# Patient Record
Sex: Female | Born: 1980 | Race: White | Hispanic: Yes | Marital: Single | Smoking: Never smoker
Health system: Southern US, Community
[De-identification: ages and names within clinical notes are randomized; demographics above are authoritative.]

## PROBLEM LIST (undated history)

## (undated) DIAGNOSIS — D649 Anemia, unspecified: Secondary | ICD-10-CM

## (undated) DIAGNOSIS — E559 Vitamin D deficiency, unspecified: Secondary | ICD-10-CM

## (undated) DIAGNOSIS — K625 Hemorrhage of anus and rectum: Secondary | ICD-10-CM

## (undated) HISTORY — DX: Vitamin D deficiency, unspecified: E55.9

## (undated) HISTORY — DX: Anemia, unspecified: D64.9

## (undated) HISTORY — DX: Hemorrhage of anus and rectum: K62.5

## (undated) HISTORY — PX: HERNIA REPAIR: SHX51

---

## 2004-06-07 ENCOUNTER — Other Ambulatory Visit: Admission: RE | Admit: 2004-06-07 | Discharge: 2004-06-07 | Payer: Self-pay | Admitting: Obstetrics & Gynecology

## 2005-01-06 ENCOUNTER — Inpatient Hospital Stay (HOSPITAL_COMMUNITY): Admission: RE | Admit: 2005-01-06 | Discharge: 2005-01-08 | Payer: Self-pay | Admitting: Obstetrics & Gynecology

## 2007-07-18 ENCOUNTER — Inpatient Hospital Stay (HOSPITAL_COMMUNITY): Admission: AD | Admit: 2007-07-18 | Discharge: 2007-07-19 | Payer: Self-pay | Admitting: Obstetrics and Gynecology

## 2008-01-27 ENCOUNTER — Ambulatory Visit: Payer: Self-pay | Admitting: Gynecology

## 2008-02-05 ENCOUNTER — Ambulatory Visit: Payer: Self-pay | Admitting: Gynecology

## 2008-02-19 ENCOUNTER — Ambulatory Visit: Payer: Self-pay | Admitting: Gynecology

## 2008-08-17 ENCOUNTER — Other Ambulatory Visit: Admission: RE | Admit: 2008-08-17 | Discharge: 2008-08-17 | Payer: Self-pay | Admitting: Gynecology

## 2008-08-17 ENCOUNTER — Ambulatory Visit: Payer: Self-pay | Admitting: Gynecology

## 2008-08-17 ENCOUNTER — Encounter: Payer: Self-pay | Admitting: Gynecology

## 2008-10-19 ENCOUNTER — Ambulatory Visit: Payer: Self-pay | Admitting: Gynecology

## 2010-01-18 ENCOUNTER — Other Ambulatory Visit: Admission: RE | Admit: 2010-01-18 | Discharge: 2010-01-18 | Payer: Self-pay | Admitting: Gynecology

## 2010-01-18 ENCOUNTER — Ambulatory Visit: Payer: Self-pay | Admitting: Gynecology

## 2010-01-27 ENCOUNTER — Ambulatory Visit: Payer: Self-pay | Admitting: Women's Health

## 2010-02-04 ENCOUNTER — Ambulatory Visit: Payer: Self-pay | Admitting: Gynecology

## 2010-03-26 ENCOUNTER — Emergency Department (HOSPITAL_BASED_OUTPATIENT_CLINIC_OR_DEPARTMENT_OTHER)
Admission: EM | Admit: 2010-03-26 | Discharge: 2010-03-26 | Payer: Self-pay | Source: Home / Self Care | Admitting: Emergency Medicine

## 2010-04-27 ENCOUNTER — Other Ambulatory Visit: Payer: Self-pay

## 2010-04-27 ENCOUNTER — Ambulatory Visit: Admit: 2010-04-27 | Payer: Self-pay | Admitting: Gynecology

## 2010-08-12 NOTE — Op Note (Signed)
NAME:  Jill Thompson, Jill Thompson NO.:  [id number]   MEDICAL RECORD NO.:  [id number]          PATIENT TYPE:  INP   LOCATION:  9129                          FACILITY:  [facility]   PHYSICIAN:  Ilda Mori, M.D.   DATE OF BIRTH:  [birth year]   DATE OF PROCEDURE:  01/06/2005  DATE OF DISCHARGE:                                 OPERATIVE REPORT   PREOPERATIVE DIAGNOSIS:  Previous cesarean section x2. Term pregnancy   POSTOP DIAGNOSIS:  Previous cesarean section x2.  Term pregnancy   PROCEDURE:  Repeat low transverse cesarean section.   SURGEON:  Dr. Ilda Mori.   ASSISTANT:  Dr. Miguel Aschoff.   ANESTHESIA:  Was spinal.   ESTIMATED BLOOD LOSS:  Was 600 mL.   SPECIMENS:  No specimens were sent to pathology.   FINDINGS:  Female infant weighing 6 pounds 5 ounces Apgar scores 9 and 9.  Clear amniotic fluid. Normal-appearing tubes and ovaries.   INDICATIONS:  This is a 30 year old gravida 3, para 2 with an estimated  confinement of November 26. The patient requested that the procedure be done  at 38 weeks for family reasons.   PROCEDURE:  The patient was taken to the operating room and spinal  anesthesia was placed. The abdomen was prepped, draped in sterile fashion.  The bladder was catheterized. A low transverse incision made through  previous laparotomy skin incision was carried down to the fascia was which  was extended transversely. The anterior rectus sheath was then divided from  the underlying rectus muscle. Rectus muscle was entered in the midline and  the peritoneum was entered and the rectus muscle and perineum were extended  bluntly. Lower segment was identified. An incision was made, carried down to  the amniotic sac which was then opened. The lower skin was then extended  bluntly. The infant was delivered with the aid of a Kiwi vacuum extractor.  Cord bloods were obtained and the placenta and cord blood was given to the  tech for cord blood collection. The uterus  was bluntly curettaged. The lower  skin was closed with a running interlocking 0 Vicryl suture and then second  layer of a running imbricating 0  Vicryl suture was then used creating a double layer closure of the lower  segment. The parietal peritoneum and rectus muscles then closed with running  3-0 Vicryl suture and the fascia was closed with 0 Vicryl suture. The skin  was closed with staples. The patient procedure well, left the operating room  in good condition.      Ilda Mori, M.D.  Electronically Signed     RK/MEDQ  D:  01/06/2005  T:  01/06/2005  Job:  [id number]

## 2010-08-12 NOTE — Discharge Summary (Signed)
NAME:  Jill Thompson, Jill Thompson NO.:  [id number]   MEDICAL RECORD NO.:  [id number]          PATIENT TYPE:  INP   LOCATION:  9129                          [facility]   PHYSICIAN:  Ilda Mori, M.D.   DATE OF BIRTH:  1980-11-13   DATE OF ADMISSION:  01/06/2005  DATE OF DISCHARGE:  01/08/2005                                 DISCHARGE SUMMARY   FINAL DIAGNOSES:  1.  History of previous cesarean section x2.  2.  Term pregnancy.   PROCEDURE:  Repeat low transverse cesarean section.  Surgeon:  Dr. Ilda Mori.  Assistant:  Dr. Miguel Aschoff.  Complications:  None.   This 30 year old, G3 P2, had an estimated date of confinement of February 19, 2005, and the patient requested the procedure be performed at 38 weeks  secondary to family reasons.  The patient had two previous cesarean sections  and was scheduled for a third with this pregnancy.  The patient's antepartum  course up to this point had been complicated by findings of increased Down  syndrome risk on her quad screen.  She declined amniocentesis and nothing  was noted on ultrasound.  The patient was taken to the operating room by Dr. Ilda Mori, on  January 06, 2005, where a repeat low transverse cesarean section was  performed with the delivery of a 6-pound 5-ounce female infant without  Apgar's of 9 and 9.  The delivery without complication.  The patient's postoperative course was benign without any significant  fevers.  She was felt ready for discharge on postoperative day #2.   She was sent home on a regular diet.   Told to decrease at her activities.   1.  Told to continue her prenatal vitamins.  2.  Was given Tylox, #30, one to two every four hours as needed for pain.  3.  Told she could use over-the-counter ibuprofen up to 600 mg every six      hours as needed for pain.   Was to follow up in the office in four weeks.   The patient, of course, is to call with increased fever, bleeding, or  pain.   DISCHARGE LABORATORY:  The patient had a hemoglobin of 11.4, white blood  cell count 10.5, and platelets of 166,000.      Leilani Able, P.A.-C.      Ilda Mori, M.D.  Electronically Signed    MB/MEDQ  D:  02/01/2005  T:  02/01/2005  Job:  [id number]

## 2010-09-15 ENCOUNTER — Emergency Department (HOSPITAL_BASED_OUTPATIENT_CLINIC_OR_DEPARTMENT_OTHER)
Admission: EM | Admit: 2010-09-15 | Discharge: 2010-09-15 | Disposition: A | Discharge status: Home / Self Care | Payer: Self-pay | Attending: Emergency Medicine | Admitting: Emergency Medicine

## 2010-09-15 DIAGNOSIS — Y9229 Other specified public building as the place of occurrence of the external cause: Secondary | ICD-10-CM | POA: Insufficient documentation

## 2010-09-15 DIAGNOSIS — S0003XA Contusion of scalp, initial encounter: Secondary | ICD-10-CM | POA: Insufficient documentation

## 2010-09-15 DIAGNOSIS — W208XXA Other cause of strike by thrown, projected or falling object, initial encounter: Secondary | ICD-10-CM | POA: Insufficient documentation

## 2010-09-19 ENCOUNTER — Emergency Department (INDEPENDENT_AMBULATORY_CARE_PROVIDER_SITE_OTHER): Payer: No Typology Code available for payment source

## 2010-09-19 ENCOUNTER — Emergency Department (HOSPITAL_BASED_OUTPATIENT_CLINIC_OR_DEPARTMENT_OTHER)
Admission: EM | Admit: 2010-09-19 | Discharge: 2010-09-19 | Disposition: A | Discharge status: Home / Self Care | Payer: No Typology Code available for payment source | Attending: Emergency Medicine | Admitting: Emergency Medicine

## 2010-09-19 DIAGNOSIS — IMO0002 Reserved for concepts with insufficient information to code with codable children: Secondary | ICD-10-CM | POA: Insufficient documentation

## 2010-09-19 DIAGNOSIS — S0003XA Contusion of scalp, initial encounter: Secondary | ICD-10-CM | POA: Insufficient documentation

## 2010-09-19 DIAGNOSIS — S1093XA Contusion of unspecified part of neck, initial encounter: Secondary | ICD-10-CM | POA: Insufficient documentation

## 2010-09-19 DIAGNOSIS — X58XXXA Exposure to other specified factors, initial encounter: Secondary | ICD-10-CM

## 2010-09-19 DIAGNOSIS — Y9301 Activity, walking, marching and hiking: Secondary | ICD-10-CM | POA: Insufficient documentation

## 2010-09-19 DIAGNOSIS — J3489 Other specified disorders of nose and nasal sinuses: Secondary | ICD-10-CM

## 2010-09-22 ENCOUNTER — Ambulatory Visit (INDEPENDENT_AMBULATORY_CARE_PROVIDER_SITE_OTHER): Payer: Managed Care, Other (non HMO) | Admitting: Gynecology

## 2010-09-22 DIAGNOSIS — N949 Unspecified condition associated with female genital organs and menstrual cycle: Secondary | ICD-10-CM

## 2010-09-22 DIAGNOSIS — B373 Candidiasis of vulva and vagina: Secondary | ICD-10-CM

## 2010-09-22 DIAGNOSIS — R823 Hemoglobinuria: Secondary | ICD-10-CM

## 2010-09-22 DIAGNOSIS — N898 Other specified noninflammatory disorders of vagina: Secondary | ICD-10-CM

## 2010-10-24 ENCOUNTER — Ambulatory Visit (INDEPENDENT_AMBULATORY_CARE_PROVIDER_SITE_OTHER): Payer: Managed Care, Other (non HMO) | Admitting: Gynecology

## 2010-10-24 VITALS — BP 112/70

## 2010-10-24 DIAGNOSIS — R198 Other specified symptoms and signs involving the digestive system and abdomen: Secondary | ICD-10-CM

## 2010-10-24 DIAGNOSIS — K649 Unspecified hemorrhoids: Secondary | ICD-10-CM

## 2010-10-24 NOTE — Progress Notes (Signed)
30 year old patient presented to the office today complaining of painful rectal area for the past 2-3 days making difficult for her to sit or even to walk. On examination it appears the patient has a significantly large external hemorrhoid very tender on contact possibly thrombosed. Although black pigmentation wasnot  noted. 2% lidocaine gel was applied to the area after inspection. She will be prescribed and an Anamantle HC 2-3 times a day. She'll do warm sitz baths. She was also given a prescription for Tylox to take one by mouth every 4-6 hours when necessary pain. A she will follow up with the general surgeon Dr. Ovidio Kin tomorrow for an appointment.

## 2010-10-25 ENCOUNTER — Encounter (INDEPENDENT_AMBULATORY_CARE_PROVIDER_SITE_OTHER): Payer: Self-pay | Admitting: Surgery

## 2010-10-25 ENCOUNTER — Ambulatory Visit (INDEPENDENT_AMBULATORY_CARE_PROVIDER_SITE_OTHER): Payer: Managed Care, Other (non HMO) | Admitting: Surgery

## 2010-10-25 VITALS — BP 104/84 | HR 64 | Temp 97.5°F | Ht 62.0 in | Wt 163.4 lb

## 2010-10-25 DIAGNOSIS — K645 Perianal venous thrombosis: Secondary | ICD-10-CM

## 2010-10-25 NOTE — Progress Notes (Signed)
Chief Complaint  Patient presents with  . Other    new pt- eval thom hems    Jill Thompson (goes by Jill Thompson) is a 30 y.o. [place] female who is a patient of Jill Thompson., MD and comes to me today for thrombosed hemorrhoids.  She has had a symptomatic hemorrhoid for 6 years, since her last daughter was born.  But this time, her hemorrhoids flared up worse starting Friday (8/27) and continued to get worse until Sunday (8/29).  She saw Dr. Livia Thompson Monday, 11/24/10, who gave her pain meds and a prescription for the hemorrhoids.  She has not filled the hemorrhoid prescription yet.  Past Medical History  Diagnosis Date  . Hemorrhoids   . Rectal bleeding   . Anemia     Past Surgical History  Procedure Date  . Cesarean section     3  . Hernia repair     Current Outpatient Prescriptions  Medication Sig Dispense Refill  . IUD's (PARAGARD INTRAUTERINE COPPER) IUD by Intrauterine route. Inserted 02/19/08       . oxyCODONE-acetaminophen (TYLOX) 5-500 MG per capsule Every 4 hours.      . Benzoyl Peroxide (ACNE MEDICATION EX) Apply topically.        . IRON PO Take by mouth.        . Oxycodone HCl 20 MG TABS Take 20 mg by mouth as needed.          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   PHYSICAL EXAM: Filed Vitals:   10/25/10 1733  BP: 104/84  Pulse: 64  Temp: 97.5 F (36.4 C)       Body mass index is 29.89 kg/(m^2).  Lungs: Clear to auscultation. Heart: Regular rate and rhythm. Abdomen: Soft, without mass or tenderness. Rectum:  Right lateral thrombosed hemorrhoid.  Smaller anterior thrombosed hemorrhoid.  Both are actually "healing".  DATA REVIEWED: None   ASSESSMENT and PLAN: 1.  Thrombosed hemorrhoid x 2.  I offered the patient medical therapy (observation) vs. Surgical drainage while in the office.  She wants to go ahead with surgical drainage.  The risks include recurrence  of the hemorrhoids, bleeding, and infection.  While in the office I did an I&D of both thrombosed hemorrhoids.  I painted the hemorrhoids with betadine.  Injected 4 cc of 1% xylocaine.  Did an I&D and removed lots from both hemorrhoids.  I gave her a prescription for: Vicodin 5/325 - #20 tabs, no refill Return to work note 10/31/2010.  2.  Chronic hemorrhoids. To address when this acute event is resolved.  I also gave her a book on hemorrhoids.

## 2010-10-25 NOTE — Patient Instructions (Addendum)
Soak in a warm tub 3 times a day for 2 weeks  Stool softener if needed (Colace)  See me back in 6 weeks

## 2010-12-03 ENCOUNTER — Encounter (HOSPITAL_BASED_OUTPATIENT_CLINIC_OR_DEPARTMENT_OTHER): Payer: Self-pay | Admitting: Emergency Medicine

## 2010-12-03 ENCOUNTER — Emergency Department (HOSPITAL_BASED_OUTPATIENT_CLINIC_OR_DEPARTMENT_OTHER)
Admission: EM | Admit: 2010-12-03 | Discharge: 2010-12-03 | Disposition: A | Discharge status: Home / Self Care | Payer: Managed Care, Other (non HMO) | Attending: Emergency Medicine | Admitting: Emergency Medicine

## 2010-12-03 DIAGNOSIS — T50905A Adverse effect of unspecified drugs, medicaments and biological substances, initial encounter: Secondary | ICD-10-CM

## 2010-12-03 DIAGNOSIS — E860 Dehydration: Secondary | ICD-10-CM | POA: Insufficient documentation

## 2010-12-03 NOTE — ED Provider Notes (Signed)
History     CSN: [id number] Arrival date & time: 12/03/2010  1:18 AM  Chief Complaint  Patient presents with  . Dehydration   The history is provided by the patient and the spouse. No language interpreter was used.  Patient is on 2 antibiotics and has been drinking red bull all day and now feels jittery.  She wonders if you can take the 2 abx together but is unable to say what the antibiotics are.  No rashes on the skin.  No CP, SOB, DOE, no n/v/d.  No cramping.  No f/c/r.  No muscle rigidity.    Past Medical History  Diagnosis Date  . Hemorrhoids   . Rectal bleeding   . Anemia     Past Surgical History  Procedure Date  . Cesarean section     3  . Hernia repair   . Cesarean section     No family history on file.  History  Substance Use Topics  . Smoking status: Never Smoker   . Smokeless tobacco: Not on file  . Alcohol Use: Yes    OB History    Grav Para Term Preterm Abortions TAB SAB Ect Mult Living                  Review of Systems  Constitutional: Negative for activity change and appetite change.  HENT: Negative for facial swelling.   Eyes: Negative for discharge and itching.  Respiratory: Negative for shortness of breath.   Cardiovascular: Negative for chest pain.  Gastrointestinal: Negative for abdominal distention.  Genitourinary: Negative for dysuria, difficulty urinating and dyspareunia.  Musculoskeletal: Negative for back pain, joint swelling, arthralgias and gait problem.  Neurological: Negative for dizziness, facial asymmetry, light-headedness and headaches.  Hematological: Negative.   Psychiatric/Behavioral: Negative.     Physical Exam  BP 152/74  Pulse 98  Temp(Src) 98 F (36.7 C) (Oral)  Resp 20  SpO2 100%  Physical Exam  Constitutional: She is oriented to person, place, and time. She appears well-developed and well-nourished. No distress.  HENT:  Head: Normocephalic and atraumatic.  Right Ear: External ear normal.  Left Ear: External  ear normal.  Eyes: EOM are normal. Pupils are equal, round, and reactive to light.  Neck: Normal range of motion. Neck supple.  Cardiovascular: Normal rate and regular rhythm.  Exam reveals no friction rub.   No murmur heard. Pulmonary/Chest: Effort normal and breath sounds normal. No stridor. No respiratory distress. She has no wheezes. She has no rales.  Abdominal: Soft. Bowel sounds are normal. She exhibits no distension. There is no tenderness. There is no rebound and no guarding.  Musculoskeletal: Normal range of motion. She exhibits no edema and no tenderness.  Lymphadenopathy:    She has no cervical adenopathy.  Neurological: She is alert and oriented to person, place, and time. She displays normal reflexes.  Skin: Skin is warm and dry. No rash noted. She is not diaphoretic. No erythema. No pallor.  Psychiatric: Judgment normal.    ED Course  Procedures       Cady Hafen K Annete Ayuso-Rasch, MD 12/03/10 0202

## 2010-12-03 NOTE — ED Notes (Signed)
Pt c/o anxiety and thirsty. Pt reports being on unknown antibiotic for URI and drinking red bull tonight at 1130. Symptoms began after drinking red bull

## 2010-12-09 ENCOUNTER — Encounter (INDEPENDENT_AMBULATORY_CARE_PROVIDER_SITE_OTHER): Payer: Managed Care, Other (non HMO) | Admitting: Surgery

## 2010-12-20 LAB — URINALYSIS, ROUTINE W REFLEX MICROSCOPIC
Glucose, UA: 250 — AB
Ketones, ur: NEGATIVE
Protein, ur: NEGATIVE
pH: 7.5

## 2010-12-20 LAB — URINE MICROSCOPIC-ADD ON

## 2010-12-20 LAB — CBC
Hemoglobin: 13
MCHC: 34.5
RBC: 4.43
WBC: 7.1

## 2010-12-22 ENCOUNTER — Encounter (INDEPENDENT_AMBULATORY_CARE_PROVIDER_SITE_OTHER): Payer: Self-pay | Admitting: Surgery

## 2011-04-11 ENCOUNTER — Other Ambulatory Visit (HOSPITAL_COMMUNITY)
Admission: RE | Admit: 2011-04-11 | Discharge: 2011-04-11 | Disposition: A | Discharge status: Home / Self Care | Payer: Managed Care, Other (non HMO) | Source: Ambulatory Visit | Attending: Gynecology | Admitting: Gynecology

## 2011-04-11 ENCOUNTER — Encounter: Payer: Self-pay | Admitting: Gynecology

## 2011-04-11 ENCOUNTER — Ambulatory Visit (INDEPENDENT_AMBULATORY_CARE_PROVIDER_SITE_OTHER): Payer: Managed Care, Other (non HMO) | Admitting: Gynecology

## 2011-04-11 ENCOUNTER — Other Ambulatory Visit: Payer: Self-pay | Admitting: Gynecology

## 2011-04-11 VITALS — BP 110/70 | Temp 98.0°F | Ht 62.0 in | Wt 157.0 lb

## 2011-04-11 DIAGNOSIS — Z01419 Encounter for gynecological examination (general) (routine) without abnormal findings: Secondary | ICD-10-CM

## 2011-04-11 DIAGNOSIS — N898 Other specified noninflammatory disorders of vagina: Secondary | ICD-10-CM

## 2011-04-11 DIAGNOSIS — M549 Dorsalgia, unspecified: Secondary | ICD-10-CM

## 2011-04-11 DIAGNOSIS — R300 Dysuria: Secondary | ICD-10-CM

## 2011-04-11 DIAGNOSIS — L680 Hirsutism: Secondary | ICD-10-CM

## 2011-04-11 DIAGNOSIS — N390 Urinary tract infection, site not specified: Secondary | ICD-10-CM

## 2011-04-11 DIAGNOSIS — R634 Abnormal weight loss: Secondary | ICD-10-CM

## 2011-04-11 LAB — CBC WITH DIFFERENTIAL/PLATELET
Basophils Relative: 0 % (ref 0–1)
HCT: 36.2 % (ref 36.0–46.0)
Hemoglobin: 11.5 g/dL — ABNORMAL LOW (ref 12.0–15.0)
Lymphocytes Relative: 28 % (ref 12–46)
Lymphs Abs: 2.1 10*3/uL (ref 0.7–4.0)
Monocytes Absolute: 0.7 10*3/uL (ref 0.1–1.0)
Monocytes Relative: 10 % (ref 3–12)
Neutro Abs: 4.3 10*3/uL (ref 1.7–7.7)
Neutrophils Relative %: 58 % (ref 43–77)
RBC: 4.25 MIL/uL (ref 3.87–5.11)
WBC: 7.4 10*3/uL (ref 4.0–10.5)

## 2011-04-11 LAB — GLUCOSE, RANDOM: Glucose, Bld: 76 mg/dL (ref 70–99)

## 2011-04-11 LAB — URINALYSIS W MICROSCOPIC + REFLEX CULTURE
Casts: NONE SEEN
Crystals: NONE SEEN
Glucose, UA: NEGATIVE mg/dL
Nitrite: NEGATIVE
Specific Gravity, Urine: 1.005 (ref 1.005–1.030)
pH: 6 (ref 5.0–8.0)

## 2011-04-11 LAB — WET PREP FOR TRICH, YEAST, CLUE
Trich, Wet Prep: NONE SEEN
Yeast Wet Prep HPF POC: NONE SEEN

## 2011-04-11 LAB — TSH: TSH: 1.817 u[IU]/mL (ref 0.350–4.500)

## 2011-04-11 LAB — CHOLESTEROL, TOTAL: Cholesterol: 194 mg/dL (ref 0–200)

## 2011-04-11 MED ORDER — NITROFURANTOIN MONOHYD MACRO 100 MG PO CAPS: 100.0000 mg | ORAL_CAPSULE | Freq: Two times a day (BID) | ORAL | Status: AC

## 2011-04-11 NOTE — Progress Notes (Signed)
Jill Thompson 05-13-1980 [id number]   History:    31 y.o.  for annual exam with complaint of dysuria and frequency for the past 3 or 4 days and some slight right flank discomfort no fever reported. Patient has a ParaGard T380A IUD placed in 2009 and is having normal menstrual cycles. She is in a monogamous relationship. She does her monthly self breast examination. Patient was complaining of some hirsutism in her chin and periareolar region. Patient was weighing 168 last years down to 157. She thought she may have a slight vaginal discharge as well.  Past medical history,surgical history, family history and social history were all reviewed and documented in the EPIC chart.  Gynecologic History Patient's last menstrual period was 03/17/2011. Contraception: IUD Last Pap: 2011. Results were: normal Last mammogram: Not done. Results were: Not done  Obstetric History OB History    Grav Para Term Preterm Abortions TAB SAB Ect Mult Living                   ROS:  Was performed and pertinent positives and negatives are included in the history.  Exam: chaperone present  BP 110/70  Temp 98 F (36.7 C)  Ht 5\' 2"  (1.575 m)  Wt 157 lb (71.215 kg)  BMI 28.72 kg/m2  LMP 03/17/2011  Body mass index is 28.72 kg/(m^2).  General appearance : Well developed well nourished female. No acute distress HEENT: Neck supple, trachea midline, no carotid bruits, no thyroidmegaly Lungs: Clear to auscultation, no rhonchi or wheezes, or rib retractions  Heart: Regular rate and rhythm, no murmurs or gallops Breast:Examined in sitting and supine position were symmetrical in appearance, no palpable masses or tenderness,  no skin retraction, no nipple inversion, no nipple discharge, no skin discoloration, no axillary or supraclavicular lymphadenopathy Abdomen: no palpable masses or tenderness, no rebound or guarding some suprapubic tenderness was noted limiting pelvic exam Extremities: no edema or skin  discoloration or tenderness  Pelvic:  Bartholin, Urethra, Skene Glands: Within normal limits             Vagina: No gross lesions or discharge  Cervix: No gross lesions or discharge  Uterus  very limited due to suprapubic tenderness as a result of her cystitis  Adnexa  unable to be done due to patient's symptoms of cystitis. Anus and perineum  normal   Rectovaginal  normal sphincter tone without palpated masses or tenderness             Hemoccult not done     Assessment/Plan:  31 y.o. female for annual exam who is wet prep today was normal but her urinalysis demonstrated 21-50 WBCs 1120 RBC. She was given a sample of Uribell anti-spasmodic to take 1 tablet 4 times a day for 2 days and a prescription will be called in for Macrobid to take 1 by mouth twice a day for 7 days. As to her hirsutism it may be constitutional but nevertheless we checked a TSH and a total testosterone level today. Because of her weight loss although she is attempting to lose weight 1 to check a random blood sugar today we'll also be checking her CBC cholesterol and Pap smear. She'll be instructed to return back in 2 weeks then we completed pelvic examination the fact that she was uncomfortable her cystitis make an incomplete exam today. Will notify her there is any abnormality of any the above mentioned test. Literature information was provided on urinary tract infection as well as on BV 2  read as well.    Ok Edwards MD, 4:35 PM 04/11/2011

## 2011-04-11 NOTE — Patient Instructions (Addendum)
Since you had a lot of discomfort because of your bladder infection I was not able to do a full pelvic exam to feel your ovaries. I will like for you to return in two weeks to do a thorough pelvic exam.   Bacterial Vaginosis Bacterial vaginosis (BV) is a vaginal infection where the normal balance of bacteria in the vagina is disrupted. The normal balance is then replaced by an overgrowth of certain bacteria. There are several different kinds of bacteria that can cause BV. BV is the most common vaginal infection in women of childbearing age. CAUSES   The cause of BV is not fully understood. BV develops when there is an increase or imbalance of harmful bacteria.   Some activities or behaviors can upset the normal balance of bacteria in the vagina and put women at increased risk including:   Having a new sex partner or multiple sex partners.   Douching.   Using an intrauterine device (IUD) for contraception.   It is not clear what role sexual activity plays in the development of BV. However, women that have never had sexual intercourse are rarely infected with BV.  Women do not get BV from toilet seats, bedding, swimming pools or from touching objects around them.  SYMPTOMS   Grey vaginal discharge.   A fish-like odor with discharge, especially after sexual intercourse.   Itching or burning of the vagina and vulva.   Burning or pain with urination.    Urinary Tract Infection Infections of the urinary tract can start in several places. A bladder infection (cystitis), a kidney infection (pyelonephritis), and a prostate infection (prostatitis) are different types of urinary tract infections (UTIs). They usually get better if treated with medicines (antibiotics) that kill germs. Take all the medicine until it is gone. You or your child may feel better in a few days, but TAKE ALL MEDICINE or the infection may not respond and may become more difficult to treat. HOME CARE INSTRUCTIONS   Drink  enough water and fluids to keep the urine clear or pale yellow. Cranberry juice is especially recommended, in addition to large amounts of water.   Avoid caffeine, tea, and carbonated beverages. They tend to irritate the bladder.   Alcohol may irritate the prostate.   Only take over-the-counter or prescription medicines for pain, discomfort, or fever as directed by your caregiver.  To prevent further infections:  Empty the bladder often. Avoid holding urine for long periods of time.   After a bowel movement, women should cleanse from front to back. Use each tissue only once.   Empty the bladder before and after sexual intercourse.  FINDING OUT THE RESULTS OF YOUR TEST Not all test results are available during your visit. If your or your child's test results are not back during the visit, make an appointment with your caregiver to find out the results. Do not assume everything is normal if you have not heard from your caregiver or the medical facility. It is important for you to follow up on all test results. SEEK MEDICAL CARE IF:   There is back pain.   Your baby is older than 3 months with a rectal temperature of 100.5 F (38.1 C) or higher for more than 1 day.   Your or your child's problems (symptoms) are no better in 3 days. Return sooner if you or your child is getting worse.  SEEK IMMEDIATE MEDICAL CARE IF:   There is severe back pain or lower abdominal pain.  You or your child develops chills.   You have a fever.   Your baby is older than 3 months with a rectal temperature of 102 F (38.9 C) or higher.   Your baby is 17 months old or younger with a rectal temperature of 100.4 F (38 C) or higher.   There is nausea or vomiting.   There is continued burning or discomfort with urination.  MAKE SURE YOU:   Understand these instructions.   Will watch your condition.   Will get help right away if you are not doing well or get worse.  Document Released: 12/21/2004  Document Revised: 11/23/2010 Document Reviewed: 07/26/2006 [facility] Patient Information 2012 [place], [place].  Some women have no signs or symptoms at all.  DIAGNOSIS  Your caregiver must examine the vagina for signs of BV. Your caregiver will perform lab tests and look at the sample of vaginal fluid through a microscope. They will look for bacteria and abnormal cells (clue cells), a pH test higher than 4.5, and a positive amine test all associated with BV.  RISKS AND COMPLICATIONS   Pelvic inflammatory disease (PID).   Infections following gynecology surgery.   Developing HIV.   Developing herpes virus.  TREATMENT  Sometimes BV will clear up without treatment. However, all women with symptoms of BV should be treated to avoid complications, especially if gynecology surgery is planned. Female partners generally do not need to be treated. However, BV may spread between female sex partners so treatment is helpful in preventing a recurrence of BV.   BV may be treated with antibiotics. The antibiotics come in either pill or vaginal cream forms. Either can be used with nonpregnant or pregnant women, but the recommended dosages differ. These antibiotics are not harmful to the baby.   BV can recur after treatment. If this happens, a second round of antibiotics will often be prescribed.   Treatment is important for pregnant women. If not treated, BV can cause a premature delivery, especially for a pregnant woman who had a premature birth in the past. All pregnant women who have symptoms of BV should be checked and treated.   For chronic reoccurrence of BV, treatment with a type of prescribed gel vaginally twice a week is helpful.  HOME CARE INSTRUCTIONS   Finish all medication as directed by your caregiver.   Do not have sex until treatment is completed.   Tell your sexual partner that you have a vaginal infection. They should see their caregiver and be treated if they have problems, such as a  mild rash or itching.   Practice safe sex. Use condoms. Only have 1 sex partner.  PREVENTION  Basic prevention steps can help reduce the risk of upsetting the natural balance of bacteria in the vagina and developing BV:  Do not have sexual intercourse (be abstinent).   Do not douche.   Use all of the medicine prescribed for treatment of BV, even if the signs and symptoms go away.   Tell your sex partner if you have BV. That way, they can be treated, if needed, to prevent reoccurrence.  SEEK MEDICAL CARE IF:   Your symptoms are not improving after 3 days of treatment.   You have increased discharge, pain, or fever.  MAKE SURE YOU:   Understand these instructions.   Will watch your condition.   Will get help right away if you are not doing well or get worse.  FOR MORE INFORMATION  Division of STD Prevention (DSTDP), Centers  for Disease Control and Prevention: [employer]Apps.[employer] American Social Health Association (ASHA): www.ashastd.org  Document Released: 03/13/2005 Document Revised: 11/23/2010 Document Reviewed: 09/03/2008 [facility] Patient Information 2012 [place], [place].

## 2011-04-13 ENCOUNTER — Encounter: Payer: Self-pay | Admitting: Anesthesiology

## 2011-04-13 LAB — URINE CULTURE: Colony Count: NO GROWTH

## 2011-04-13 MED ORDER — CLINDAMYCIN PHOSPHATE 2 % VA CREA: 1.0000 | TOPICAL_CREAM | Freq: Every day | VAGINAL | Status: AC

## 2011-04-13 NOTE — Progress Notes (Signed)
Addended by: Bertram Savin A on: 04/13/2011 10:16 AM   Modules accepted: Orders

## 2011-04-25 ENCOUNTER — Ambulatory Visit (INDEPENDENT_AMBULATORY_CARE_PROVIDER_SITE_OTHER): Payer: Managed Care, Other (non HMO) | Admitting: Gynecology

## 2011-04-25 ENCOUNTER — Encounter: Payer: Self-pay | Admitting: Gynecology

## 2011-04-25 ENCOUNTER — Ambulatory Visit (INDEPENDENT_AMBULATORY_CARE_PROVIDER_SITE_OTHER): Payer: Managed Care, Other (non HMO)

## 2011-04-25 VITALS — BP 116/70

## 2011-04-25 DIAGNOSIS — N949 Unspecified condition associated with female genital organs and menstrual cycle: Secondary | ICD-10-CM

## 2011-04-25 DIAGNOSIS — N8300 Follicular cyst of ovary, unspecified side: Secondary | ICD-10-CM

## 2011-04-25 DIAGNOSIS — R102 Pelvic and perineal pain: Secondary | ICD-10-CM

## 2011-04-25 DIAGNOSIS — N942 Vaginismus: Secondary | ICD-10-CM

## 2011-04-25 DIAGNOSIS — Z30431 Encounter for routine checking of intrauterine contraceptive device: Secondary | ICD-10-CM

## 2011-04-25 NOTE — Patient Instructions (Signed)
Auto examen de mama (Breast Self-Exam) El auto examen puede ayudarla a encontrar modificaciones o trastornos en la mama cuando todava son pequeos. Haga el auto examen de la mama:  Todos los meses.   Una semana despus de su perodo (ciclo menstrual o periodo menstrual).   El primer da de cada mes, si ya no tiene el perodo.  Debe estar atenta a:  Cambios en el color, tamao o forma de la mama.   Hoyuelos en los senos.   Modificaciones en los pezones o la piel.   Sequedad en la piel de los senos o los pezones.   Secreciones acuosas o sanguinolentas en los pezones.  Trate de sentir la presencia de:  Bultos.   Durezas.   Cualquier otro cambio.  CUIDADOS EN EL HOGAR  Hay 3 formas de hacer el examen autoexamen de mama: Prese frente a un espejo.  Levante los brazos por arriba de la cabeza y gire de un lado al otro.   Coloque las manos en las caderas e inclnese hacia abajo y luego gire de un lado al otro.   Inclnese hacia delante y gire de un lado al otro.  En la ducha.  Con las manos enjabonadas, revise los dos pechos. Luego controle por arriba y por debajo de la clavcula y las axilas.   Pase los dedos por la zona superior e inferior de la clavcula hasta debajo del seno, y desde el centro del pecho hasta el borde exterior de la axila. Controle si hay bultos o zonas duras.   Utilizando las yemas de tres dedos del medio revise todo su seno presionando la mano sobre el pecho, haciendo crculos o movimientos hacia arriba y hacia abajo.  Acostada.  Acustese sobre su cama.   Coloque una almohada pequea debajo de la mama que va a controlar. En esa misma posicin, ponga la mano detrs de la cabeza.   Con la otra mano, use los 3 dedos del medio para palpar el pecho.   Mueva los dedos en un crculo alrededor de la mama. Presione firmemente sobre todas las partes de la mama para detectar bultos.  SOLICITE AYUDA DE INMEDIATO SI: Encuentra algn cambio para que puedan  realizarle un estudio. Document Released: 04/15/2010 Document Revised: 11/23/2010 [employer] Patient Information 2012 [employer]. 

## 2011-04-25 NOTE — Progress Notes (Signed)
Patient returned back to the office today and to complete her pelvic exam that was not possible to be done when she was here on January 15 since she had a urinary tract infection (cystitis) and was very uncomfortable. She was treated with Macrobid one by mouth twice a day for 7 days along with Uribell anti-spasmodic agent for 2 days. She is almost past completed her treatment and is doing a lot better. We were able to do her Pap smear which was as follows:  GYNECOLOGIC CYTOLOGY REPORT Adequacy Reason Satisfactory for evaluation, endocervical/transformation zone component PRESENT. Diagnosis NEGATIVE FOR INTRAEPITHELIAL LESIONS OR MALIGNANCY. BENIGN REACTIVE/REPARATIVE CHANGES.  We attempted to do her bimanual examination to complete her physical exam from last visit but once again due to her vaginismus it was not possible. Speculum exam demonstrated no vaginal lesions or cervical lesions the IUD string was not seen. External genitalia was otherwise unremarkable.  Ultrasound today demonstrated uterus that measured 10 x 7.4 x 4.9 cm with an endometrial stripe of 4.8 mm. IUD string was seen in normal position and the endometrial cavity. Both ovaries normal otherwise.  Patient was reassured recent lab work normal with exception of mild iron deficiency anemia with a hemoglobin 11.5. She was instructed take iron tablet one daily. She was encouraged to do her monthly self breast examination. We'll see her back in one year or when necessary.

## 2011-09-18 ENCOUNTER — Ambulatory Visit (INDEPENDENT_AMBULATORY_CARE_PROVIDER_SITE_OTHER): Payer: Managed Care, Other (non HMO) | Admitting: Women's Health

## 2011-09-18 ENCOUNTER — Encounter: Payer: Self-pay | Admitting: Women's Health

## 2011-09-18 DIAGNOSIS — R300 Dysuria: Secondary | ICD-10-CM

## 2011-09-18 DIAGNOSIS — N390 Urinary tract infection, site not specified: Secondary | ICD-10-CM

## 2011-09-18 LAB — URINALYSIS W MICROSCOPIC + REFLEX CULTURE
Bilirubin Urine: NEGATIVE
Casts: NONE SEEN
Crystals: NONE SEEN
Glucose, UA: NEGATIVE mg/dL
Ketones, ur: NEGATIVE mg/dL
Nitrite: NEGATIVE
Protein, ur: >300 mg/dL — AB
Specific Gravity, Urine: 1.025 (ref 1.005–1.030)
Urobilinogen, UA: 0.2 mg/dL (ref 0.0–1.0)
pH: 6.5 (ref 5.0–8.0)

## 2011-09-18 MED ORDER — SULFAMETHOXAZOLE-TRIMETHOPRIM 800-160 MG PO TABS: 1.0000 | ORAL_TABLET | Freq: Two times a day (BID) | ORAL | Status: AC

## 2011-09-18 NOTE — Progress Notes (Signed)
Patient ID: VELMER BROADFOOT, female   DOB: [birth year], 31 y.o.   MRN: [id number] Presents with the complaint of increased urinary frequency, pressure, urgency, and pain at the end of the stream of urination. States may have had a fever none at the present. Was treated with Cipro 500 twice a day for 7 days last week at primary care. Denies any problem or change in discharge. Same partner. Has had increased frequency of intercourse.   exam: Abdomen soft nontender, no CVAT, . UA: TNTC WBCs, many bacteria, large blood, TNTC rbc's.  UTI unresponsive to Cipro  Plan: Urine culture pending.  Septra DS one by mouth twice a day for 3 days, samples of Uribel were given to take one 4 times a day today. Reviewed importance of increasing plain water, decreasing frequency of intercourse this week. Test of cure UA in one week. Instructed to call if no relief of symptoms.

## 2011-09-18 NOTE — Patient Instructions (Addendum)
Asymptomatic Bacteriuria, Female Your urine study shows bacteria in your urine. You do not have the usual symptoms of burning or frequent urination. This is why it is called asymptomatic. You may need treatment with antibiotics. Treatment is especially important if you are pregnant. Sometimes this condition can progress to a more severe bladder or kidney infection. Symptoms include burning when urinating, back pain, fever, nausea, or vomiting. Take your antibiotics as directed. Finish them even if you start to feel better. Drink enough water and fluids to keep your urine clear or pale yellow. Go to the bathroom more frequently to keep your bladder empty. Keep the area around the vagina and rectum clean. Wipe yourself from front to back after urinating. Call your caregiver to arrange for follow-up care.  SEEK IMMEDIATE MEDICAL CARE IF:  You develop repeated vomiting.   You develop severe back or abdominal pain.   You have abnormal vaginal discharge or bleeding.   You have blood in the urine.   You develop cramping or abdominal pain.   You have a fever.  If you are pregnant and develop any of the above problems see your caregiver or seek care immediately. Document Released: 03/13/2005 Document Revised: 03/02/2011 Document Reviewed: 01/27/2009 [facility] Patient Information 2012 [place], [place].

## 2011-09-22 LAB — URINE CULTURE: Colony Count: >=100000

## 2011-09-26 ENCOUNTER — Other Ambulatory Visit: Payer: Self-pay | Admitting: Women's Health

## 2011-09-26 DIAGNOSIS — N390 Urinary tract infection, site not specified: Secondary | ICD-10-CM

## 2011-10-06 ENCOUNTER — Other Ambulatory Visit: Payer: Managed Care, Other (non HMO)

## 2011-11-14 ENCOUNTER — Encounter: Payer: Self-pay | Admitting: *Deleted

## 2011-11-14 NOTE — Progress Notes (Signed)
Patient ID: Jill Thompson, female   DOB: March 07, 1981, 31 y.o.   MRN: [id number] Pt calling c/o UTI s/s, left on voicemail OV best.

## 2011-11-15 ENCOUNTER — Ambulatory Visit (INDEPENDENT_AMBULATORY_CARE_PROVIDER_SITE_OTHER): Payer: Managed Care, Other (non HMO) | Admitting: Gynecology

## 2011-11-15 ENCOUNTER — Encounter: Payer: Self-pay | Admitting: Gynecology

## 2011-11-15 VITALS — BP 116/74 | Temp 98.4°F

## 2011-11-15 DIAGNOSIS — R300 Dysuria: Secondary | ICD-10-CM

## 2011-11-15 DIAGNOSIS — N390 Urinary tract infection, site not specified: Secondary | ICD-10-CM

## 2011-11-15 LAB — URINALYSIS W MICROSCOPIC + REFLEX CULTURE
Bilirubin Urine: NEGATIVE
Ketones, ur: NEGATIVE mg/dL
Nitrite: NEGATIVE
Urobilinogen, UA: 0.2 mg/dL (ref 0.0–1.0)

## 2011-11-15 MED ORDER — NITROFURANTOIN MONOHYD MACRO 100 MG PO CAPS: 100.0000 mg | ORAL_CAPSULE | Freq: Two times a day (BID) | ORAL | Status: AC

## 2011-11-15 NOTE — Patient Instructions (Addendum)

## 2011-11-15 NOTE — Progress Notes (Signed)
Patient presented to the office today complaining of dysuria and frequency. She denied any back pain. She denies any fever chills nausea or vomiting. She has a [employer] T380A IUD. Her cycles are regular. She's contemplated getting pregnant shear. This is patient's third UTI this year. She attributes it after intercourse. She states that she has intercourse daily. She is in a monogamous relationship.  Exam/lap: Patient with no CVA tenderness but did have some suprapubic tenderness. Urinalysis 21-50 WBC many bacteria  Assessment/plan: Urinary tract infection. Patient will be treated with Macrobid one by mouth twice a day for 7 days. She will be given samples of Uribell antispasmodic agent to take 1 by mouth 4 times a day for 2 days. She was instructed to increase her fluid intake. If she develops any CVA tenderness fever chills nausea or vomiting she'll report to the office or after hours to the emergency room. We did discuss about possibly referral to the urologist if she continues to have recurrent urinary tract infections. Her urinary tract infections may be attributed to her frequency of intercourse. 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 She states that she would like to try this first and if she continues to have recurrent urinary tract infections despite the above regimen she will then seek a followup appointment with the urologist.

## 2011-11-18 LAB — URINE CULTURE: Colony Count: >=100000

## 2012-04-16 ENCOUNTER — Ambulatory Visit: Payer: Managed Care, Other (non HMO) | Admitting: Gynecology

## 2012-11-07 ENCOUNTER — Ambulatory Visit: Payer: Self-pay | Admitting: Gynecology

## 2013-01-13 ENCOUNTER — Other Ambulatory Visit: Payer: Self-pay | Admitting: Obstetrics & Gynecology

## 2013-01-14 ENCOUNTER — Encounter: Payer: Self-pay | Admitting: Gynecology

## 2013-01-14 ENCOUNTER — Ambulatory Visit (INDEPENDENT_AMBULATORY_CARE_PROVIDER_SITE_OTHER): Payer: Medicaid Other | Admitting: Gynecology

## 2013-01-14 VITALS — BP 118/76 | Ht 62.5 in | Wt 160.0 lb

## 2013-01-14 DIAGNOSIS — Z01419 Encounter for gynecological examination (general) (routine) without abnormal findings: Secondary | ICD-10-CM

## 2013-01-14 DIAGNOSIS — L680 Hirsutism: Secondary | ICD-10-CM

## 2013-01-14 LAB — CBC WITH DIFFERENTIAL/PLATELET
Basophils Absolute: 0 10*3/uL (ref 0.0–0.1)
Eosinophils Relative: 3 % (ref 0–5)
Lymphocytes Relative: 30 % (ref 12–46)
Neutro Abs: 4.3 10*3/uL (ref 1.7–7.7)
Neutrophils Relative %: 59 % (ref 43–77)
Platelets: 235 10*3/uL (ref 150–400)
RBC: 4.39 MIL/uL (ref 3.87–5.11)
RDW: 15.7 % — ABNORMAL HIGH (ref 11.5–15.5)
WBC: 7.4 10*3/uL (ref 4.0–10.5)

## 2013-01-14 NOTE — Patient Instructions (Signed)

## 2013-01-14 NOTE — Progress Notes (Signed)
Jill Thompson [birth year] [id number]   History:    32 y.o.  for annual gyn exam who is contemplating getting pregnant next year. She has a ParaGard T380A IUD that was placed in 2009. She was thinking about removing the IUD this year before attempting to get pregnant next year and had questions. She reports her menstrual cycles are regular. Today is her second day of her cycle. Last Pap smear was normal 2013 she denies any prior history of abnormal Pap smear. Patient not interested in the flu vaccine. She stated that she noticed a few hairs underneath her chin and around her umbilicus. Patient a few years ago had hemorrhoidectomy and was concerned that she had a recurrence of her hemorrhoids.  Past medical history,surgical history, family history and social history were all reviewed and documented in the EPIC chart.  Gynecologic History Patient's last menstrual period was 01/13/2013. Contraception: IUD Last Pap: 2013. Results were: normal Last mammogram: not indicated. Results were: none indicated  Obstetric History OB History  Gravida Para Term Preterm AB SAB TAB Ectopic Multiple Living  4 3   1 1    3     # Outcome Date GA Lbr Len/2nd Weight Sex Delivery Anes PTL Lv  4 SAB           3 PAR           2 PAR           1 PAR                ROS: A ROS was performed and pertinent positives and negatives are included in the history.  GENERAL: No fevers or chills. HEENT: No change in vision, no earache, sore throat or sinus congestion. NECK: No pain or stiffness. CARDIOVASCULAR: No chest pain or pressure. No palpitations. PULMONARY: No shortness of breath, cough or wheeze. GASTROINTESTINAL: No abdominal pain, nausea, vomiting or diarrhea, melena or bright red blood per rectum. GENITOURINARY: No urinary frequency, urgency, hesitancy or dysuria. MUSCULOSKELETAL: No joint or muscle pain, no back pain, no recent trauma. DERMATOLOGIC: hair underneath her chin and bellybuttonENDOCRINE: No polyuria,  polydipsia, no heat or cold intolerance. No recent change in weight. HEMATOLOGICAL: No anemia or easy bruising or bleeding. NEUROLOGIC: No headache, seizures, numbness, tingling or weakness. PSYCHIATRIC: No depression, no loss of interest in normal activity or change in sleep pattern.     Exam: chaperone present  BP 118/76  Ht 5' 2.5" (1.588 m)  Wt 160 lb (72.576 kg)  BMI 28.78 kg/m2  LMP 01/13/2013  Body mass index is 28.78 kg/(m^2).  General appearance : Well developed well nourished female. No acute distress HEENT: Neck supple, trachea midline, no carotid bruits, no thyroidmegaly Lungs: Clear to auscultation, no rhonchi or wheezes, or rib retractions  Heart: Regular rate and rhythm, no murmurs or gallops Breast:Examined in sitting and supine position were symmetrical in appearance, no palpable masses or tenderness,  no skin retraction, no nipple inversion, no nipple discharge, no skin discoloration, no axillary or supraclavicular lymphadenopathy Abdomen: no palpable masses or tenderness, no rebound or guarding Extremities: no edema or skin discoloration or tenderness  Pelvic:  Bartholin, Urethra, Skene Glands: Within normal limits             Vagina: No gross lesions or discharge, menstrual blood  Cervix: No gross lesions or discharge, IUD string  Uterus  anteverted, normal size, shape and consistency, non-tender and mobile  Adnexa  Without masses or tenderness  Anus and perineum  normal  Rectovaginal  normal sphincter tone without palpated masses or tenderness             Hemoccult not indicated     Assessment/Plan:  32 y.o. female for annual exam who was reassured that she did not have any thrombosed hemorrhoids or inflamed. We discussed leaving the IUD until next year when she plans on conceiving. Pap smear was not done today in accordance to the new guidelines. The following labs were ordered: CBC, comprehensive metabolic panel, TSH, urinalysis, screen cholesterol, TSH and  serum testosterone level. Patient was reminded to begin taking prenatal vitamins 1 daily and to engage and monthly self breast exams. Patient declined a flu vaccine.  Note: This dictation was prepared with  Dragon/digital dictation along withSmart phrase technology. Any transcriptional errors that result from this process are unintentional.   Ok Edwards MD, 5:59 PM 01/14/2013

## 2013-01-15 LAB — URINALYSIS W MICROSCOPIC + REFLEX CULTURE
Bilirubin Urine: NEGATIVE
Crystals: NONE SEEN
Glucose, UA: NEGATIVE mg/dL
Ketones, ur: NEGATIVE mg/dL
Protein, ur: 30 mg/dL — AB
RBC / HPF: >50 RBC/hpf — AB (ref <3)

## 2013-01-15 LAB — COMPREHENSIVE METABOLIC PANEL
ALT: 15 U/L (ref 0–35)
AST: 21 U/L (ref 0–37)
Calcium: 9.3 mg/dL (ref 8.4–10.5)
Chloride: 105 mEq/L (ref 96–112)
Creat: 0.91 mg/dL (ref 0.50–1.10)
Potassium: 3.9 mEq/L (ref 3.5–5.3)
Sodium: 138 mEq/L (ref 135–145)
Total Protein: 6.9 g/dL (ref 6.0–8.3)

## 2013-01-17 ENCOUNTER — Other Ambulatory Visit: Payer: Self-pay | Admitting: *Deleted

## 2013-01-17 ENCOUNTER — Encounter (INDEPENDENT_AMBULATORY_CARE_PROVIDER_SITE_OTHER): Payer: Self-pay | Admitting: General Surgery

## 2013-01-17 ENCOUNTER — Telehealth: Payer: Self-pay | Admitting: *Deleted

## 2013-01-17 ENCOUNTER — Ambulatory Visit (INDEPENDENT_AMBULATORY_CARE_PROVIDER_SITE_OTHER): Payer: Medicaid Other | Admitting: General Surgery

## 2013-01-17 VITALS — BP 118/72 | HR 60 | Temp 99.1°F | Resp 15 | Ht 62.0 in | Wt 158.4 lb

## 2013-01-17 DIAGNOSIS — K645 Perianal venous thrombosis: Secondary | ICD-10-CM | POA: Insufficient documentation

## 2013-01-17 MED ORDER — HYDROCORTISONE ACETATE 30 MG RE SUPP: RECTAL | Status: DC

## 2013-01-17 MED ORDER — CIPROFLOXACIN HCL 250 MG PO TABS: 250.0000 mg | ORAL_TABLET | Freq: Two times a day (BID) | ORAL | Status: DC

## 2013-01-17 MED ORDER — DIBUCAINE 1 % EX OINT: TOPICAL_OINTMENT | Freq: Three times a day (TID) | CUTANEOUS | Status: DC | PRN

## 2013-01-17 NOTE — Telephone Encounter (Signed)
Call in a prescription for Proctocort 30 mg suppository. After applying 1 rectally in the morning and 1 in the evening for 2 weeks. Please prescribe 28 with 2 refills

## 2013-01-17 NOTE — Telephone Encounter (Signed)
Pt said the sample you gave her on OV 01/14/13 for hemorrhoids is not helping, pt still having pain. Pt asked if you could give her something stronger? Please advise

## 2013-01-17 NOTE — Telephone Encounter (Signed)
Pt informed, rx sent 

## 2013-01-17 NOTE — Patient Instructions (Signed)
Baby wipes after bm's Warm tub soaks twice a day Dibucaine ointment as needed Colace and miralax to avoid constipation

## 2013-01-18 LAB — URINE CULTURE: Colony Count: >=100000

## 2013-01-27 ENCOUNTER — Encounter (INDEPENDENT_AMBULATORY_CARE_PROVIDER_SITE_OTHER): Payer: Medicaid Other | Admitting: General Surgery

## 2013-02-12 ENCOUNTER — Encounter (INDEPENDENT_AMBULATORY_CARE_PROVIDER_SITE_OTHER): Payer: Medicaid Other | Admitting: General Surgery

## 2013-02-12 NOTE — Progress Notes (Signed)
Patient ID: Jill Thompson, female   DOB: 07-15-1980, 32 y.o.   MRN: [id number]  Chief Complaint  Patient presents with  . Follow-up    thrombosed hems    HPI Jill Thompson is a 32 y.o. female.  We are asked to see the pt in consultation by Dr. Riley Nearing to evaluate her for hemorrhoids. The pt is a 31 yo female who complains of a painful hemorrhoid that started about 2 weeks ago. She denies any bleeding. She is having normal bm's. She has continued to have pain  HPI  Past Medical History  Diagnosis Date  . Hemorrhoids   . Rectal bleeding   . Anemia     Past Surgical History  Procedure Laterality Date  . Hernia repair    . Cesarean section      3  . Cesarean section      History reviewed. No pertinent family history.  Social History History  Substance Use Topics  . Smoking status: Never Smoker   . Smokeless tobacco: Never Used  . Alcohol Use: Yes     Comment: SOCIALLY    No Known Allergies  Current Outpatient Prescriptions  Medication Sig Dispense Refill  . Benzoyl Peroxide (ACNE MEDICATION EX) Apply topically.        . ciprofloxacin (CIPRO) 250 MG tablet Take 1 tablet (250 mg total) by mouth 2 (two) times daily.  14 tablet  0  . HYDROCORTISONE ACE, RECTAL, 30 MG SUPP Applying 1 rectally in the morning and 1 in the evening for 2 weeks  28 each  2  . IRON PO Take by mouth.        . IUD's (PARAGARD INTRAUTERINE COPPER) IUD by Intrauterine route. Inserted 02/19/08       . Multiple Vitamin (MULTIVITAMIN) tablet Take 1 tablet by mouth daily.      . dibucaine (NUPERCAINAL) 1 % ointment Apply topically 3 (three) times daily as needed for pain.  30 g  0   No current facility-administered medications for this visit.    Review of Systems Review of Systems  Constitutional: Negative.   HENT: Negative.   Eyes: Negative.   Respiratory: Negative.   Cardiovascular: Negative.   Gastrointestinal: Positive for rectal pain. Negative for blood in stool.  Endocrine: Negative.    Genitourinary: Negative.   Musculoskeletal: Negative.   Skin: Negative.   Allergic/Immunologic: Negative.   Neurological: Negative.   Hematological: Negative.   Psychiatric/Behavioral: Negative.     Blood pressure 118/72, pulse 60, temperature 99.1 F (37.3 C), temperature source Temporal, resp. rate 15, height 5\' 2"  (1.575 m), weight 158 lb 6.4 oz (71.85 kg), last menstrual period 01/13/2013.  Physical Exam Physical Exam  Constitutional: She is oriented to person, place, and time. She appears well-developed and well-nourished.  HENT:  Head: Normocephalic and atraumatic.  Eyes: Conjunctivae and EOM are normal. Pupils are equal, round, and reactive to light.  Neck: Normal range of motion. Neck supple.  Cardiovascular: Normal rate, regular rhythm and normal heart sounds.   Pulmonary/Chest: Effort normal and breath sounds normal.  Abdominal: Soft. Bowel sounds are normal.  Genitourinary:  On exam she has evidence of an external thrombosed hemorrhoid that is resolving  Musculoskeletal: Normal range of motion.  Neurological: She is alert and oriented to person, place, and time.  Skin: Skin is warm and dry.  Psychiatric: She has a normal mood and affect. Her behavior is normal.    Data Reviewed As above  Assessment    The pt appears to  have an external thrombosed hemorrhoid that is resolving     Plan    At this point I would recommend baby wipes, stool softeners, tucks pads and warm tub soaks and I think this will continue to resolve without surgical intervention. We will plan to see her back in 2 weeks to check her progress        TOTH III,Redford Behrle S 02/12/2013, 9:54 AM

## 2013-02-25 ENCOUNTER — Ambulatory Visit: Payer: Medicaid Other | Admitting: Gynecology

## 2013-02-26 ENCOUNTER — Ambulatory Visit (INDEPENDENT_AMBULATORY_CARE_PROVIDER_SITE_OTHER): Payer: Medicaid Other | Admitting: Gynecology

## 2013-02-26 ENCOUNTER — Encounter: Payer: Self-pay | Admitting: Gynecology

## 2013-02-26 VITALS — BP 112/76

## 2013-02-26 DIAGNOSIS — Z30432 Encounter for removal of intrauterine contraceptive device: Secondary | ICD-10-CM

## 2013-02-26 NOTE — Patient Instructions (Signed)
Influenza Vaccine (Flu Vaccine, Inactivated) 2013 2014 What You Need to Know WHY GET VACCINATED?  Influenza ("flu") is a contagious disease that spreads around the [place] every winter, usually between October and May.  Flu is caused by the influenza virus, and can be spread by coughing, sneezing, and close contact.  Anyone can get flu, but the risk of getting flu is highest among children. Symptoms come on suddenly and may last several days. They can include:  Fever or chills.  Sore throat.  Muscle aches.  Fatigue.  Cough.  Headache.  Runny or stuffy nose. Flu can make some people much sicker than others. These people include young children, people 65 and older, pregnant women, and people with certain health conditions such as heart, lung or kidney disease, or a weakened immune system. Flu vaccine is especially important for these people, and anyone in close contact with them. Flu can also lead to pneumonia, and make existing medical conditions worse. It can cause diarrhea and seizures in children. Each year thousands of people in the [place] die from flu, and many more are hospitalized. Flu vaccine is the best protection we have from flu and its complications. Flu vaccine also helps prevent spreading flu from person to person. INACTIVATED FLU VACCINE There are 2 types of influenza vaccine:  You are getting an inactivated flu vaccine, which does not contain any live influenza virus. It is given by injection with a needle, and often called the "flu shot."  A different live, attenuated (weakened) influenza vaccine is sprayed into the nostrils. This vaccine is described in a separate Vaccine Information Statement. Flu vaccine is recommended every year. Children 6 months through 8 years of age should get 2 doses the first year they get vaccinated. Flu viruses are always changing. Each year's flu vaccine is made to protect from viruses that are most likely to cause disease  that year. While flu vaccine cannot prevent all cases of flu, it is our best defense against the disease. Inactivated flu vaccine protects against 3 or 4 different influenza viruses. It takes about 2 weeks for protection to develop after the vaccination, and protection lasts several months to a year. Some illnesses that are not caused by influenza virus are often mistaken for flu. Flu vaccine will not prevent these illnesses. It can only prevent influenza. A "high-dose" flu vaccine is available for people 65 years of age and older. The person giving you the vaccine can tell you more about it. Some inactivated flu vaccine contains a very small amount of a mercury-based preservative called thimerosal. Studies have shown that thimerosal in vaccines is not harmful, but flu vaccines that do not contain a preservative are available. SOME PEOPLE SHOULD NOT GET THIS VACCINE Tell the person who gives you the vaccine:  If you have any severe (life-threatening) allergies. If you ever had a life-threatening allergic reaction after a dose of flu vaccine, or have a severe allergy to any part of this vaccine, you may be advised not to get a dose. Most, but not all, types of flu vaccine contain a small amount of egg.  If you ever had Guillain Barr Syndrome (a severe paralyzing illness, also called GBS). Some people with a history of GBS should not get this vaccine. This should be discussed with your doctor.  If you are not feeling well. They might suggest waiting until you feel better. But you should come back. RISKS OF A VACCINE REACTION With a vaccine, like any medicine, there   If you are not feeling well. They might suggest waiting until you feel better. But you should come back.  RISKS OF A VACCINE REACTION  With a vaccine, like any medicine, there is a chance of side effects. These are usually mild and go away on their own.  Serious side effects are also possible, but are very rare. Inactivated flu vaccine does not contain live flu virus, sogetting flu from this vaccine is not possible.  Brief fainting spells and related symptoms (such as jerking movements) can happen after any medical  procedure, including vaccination. Sitting or lying down for about 15 minutes after a vaccination can help prevent fainting and injuries caused by falls. Tell your doctor if you feel dizzy or lightheaded, or have vision changes or ringing in the ears.  Mild problems following inactivated flu vaccine:  · Soreness, redness, or swelling where the shot was given.  · Hoarseness; sore, red or itchy eyes; or cough.  · Fever.  · Aches.  · Headache.  · Itching.  · Fatigue.  If these problems occur, they usually begin soon after the shot and last 1 or 2 days.  Moderate problems following inactivated flu vaccine:  · Young children who get inactivated flu vaccine and pneumococcal vaccine (PCV13) at the same time may be at increased risk for seizures caused by fever. Ask your doctor for more information. Tell your doctor if a child who is getting flu vaccine has ever had a seizure.  Severe problems following inactivated flu vaccine:  · A severe allergic reaction could occur after any vaccine (estimated less than 1 in a million doses).  · There is a small possibility that inactivated flu vaccine could be associated with Guillan Barré Syndrome (GBS), no more than 1 or 2 cases per million people vaccinated. This is much lower than the risk of severe complications from flu, which can be prevented by flu vaccine.  The safety of vaccines is always being monitored. For more information, visit: www.cdc.gov/vaccinesafety/  WHAT IF THERE IS A SERIOUS REACTION?  What should I look for?  · Look for anything that concerns you, such as signs of a severe allergic reaction, very high fever, or behavior changes.  Signs of a severe allergic reaction can include hives, swelling of the face and throat, difficulty breathing, a fast heartbeat, dizziness, and weakness. These would start a few minutes to a few hours after the vaccination.  What should I do?  · If you think it is a severe allergic reaction or other emergency that cannot wait, call 9 1 1  or get the person to the nearest hospital. Otherwise, call your doctor.  · Afterward, the reaction should be reported to the Vaccine Adverse Event Reporting System (VAERS). Your doctor might file this report, or you can do it yourself through the VAERS website at www.vaers.hhs.gov, or by calling 1-800-822-7967.  VAERS is only for reporting reactions. They do not give medical advice.  THE NATIONAL VACCINE INJURY COMPENSATION PROGRAM  The National Vaccine Injury Compensation Program (VICP) is a federal program that was created to compensate people who may have been injured by certain vaccines.  Persons who believe they may have been injured by a vaccine can learn about the program and about filing a claim by calling 1-800-338-2382 or visiting the VICP website at www.hrsa.gov/vaccinecompensation  HOW CAN I LEARN MORE?  · Ask your doctor.  · Call your local or state health department.  · Contact the Centers for Disease Control and Prevention (CDC):  ·

## 2013-02-26 NOTE — Progress Notes (Signed)
Patient is a 32 year old gravida 4 para 3 Ab1 who presented to the office today to remove her ParaGard T380A IUD since she is contemplating on getting pregnant. Patient was seen in the office on October 21 of this year for her annual gynecological examination. Patient has been having normal menstrual cycles. Patient currently on prenatal vitamins.  Exam/procedure note: Bartholin urethra Skene glands: Within normal limits Vagina: No lesions or discharge Cervix: No lesions or discharge IUD string seen  With the use of a [place] clamp the IUD string was grasped and retrieved shown to the patient and discarded.  Assessment/plan:  T380A IUD removed per patient's request she she is contemplating on getting pregnant. She will continue her prenatal vitamins. We discussed the utilization of ovulation predictor kit that time her intercourse. Patient was counseled and received a flu vaccine today.

## 2013-06-20 ENCOUNTER — Ambulatory Visit (INDEPENDENT_AMBULATORY_CARE_PROVIDER_SITE_OTHER): Payer: Medicaid Other | Admitting: Gynecology

## 2013-06-20 ENCOUNTER — Encounter: Payer: Self-pay | Admitting: Gynecology

## 2013-06-20 DIAGNOSIS — N898 Other specified noninflammatory disorders of vagina: Secondary | ICD-10-CM

## 2013-06-20 DIAGNOSIS — B9689 Other specified bacterial agents as the cause of diseases classified elsewhere: Secondary | ICD-10-CM

## 2013-06-20 DIAGNOSIS — IMO0002 Reserved for concepts with insufficient information to code with codable children: Secondary | ICD-10-CM

## 2013-06-20 DIAGNOSIS — R29898 Other symptoms and signs involving the musculoskeletal system: Secondary | ICD-10-CM

## 2013-06-20 DIAGNOSIS — A499 Bacterial infection, unspecified: Secondary | ICD-10-CM

## 2013-06-20 DIAGNOSIS — L680 Hirsutism: Secondary | ICD-10-CM

## 2013-06-20 DIAGNOSIS — N979 Female infertility, unspecified: Secondary | ICD-10-CM

## 2013-06-20 DIAGNOSIS — M6289 Other specified disorders of muscle: Secondary | ICD-10-CM

## 2013-06-20 DIAGNOSIS — N760 Acute vaginitis: Secondary | ICD-10-CM

## 2013-06-20 LAB — CBC WITH DIFFERENTIAL/PLATELET
BASOS ABS: 0 10*3/uL (ref 0.0–0.1)
Basophils Relative: 0 % (ref 0–1)
EOS ABS: 0.2 10*3/uL (ref 0.0–0.7)
Eosinophils Relative: 3 % (ref 0–5)
HCT: 38 % (ref 36.0–46.0)
Hemoglobin: 12.9 g/dL (ref 12.0–15.0)
Lymphocytes Relative: 39 % (ref 12–46)
Lymphs Abs: 2 10*3/uL (ref 0.7–4.0)
MCH: 29.9 pg (ref 26.0–34.0)
MCHC: 33.9 g/dL (ref 30.0–36.0)
MCV: 88 fL (ref 78.0–100.0)
Monocytes Absolute: 0.5 10*3/uL (ref 0.1–1.0)
Monocytes Relative: 10 % (ref 3–12)
NEUTROS PCT: 48 % (ref 43–77)
Neutro Abs: 2.5 10*3/uL (ref 1.7–7.7)
PLATELETS: 217 10*3/uL (ref 150–400)
RBC: 4.32 MIL/uL (ref 3.87–5.11)
RDW: 14 % (ref 11.5–15.5)
WBC: 5.2 10*3/uL (ref 4.0–10.5)

## 2013-06-20 LAB — WET PREP FOR TRICH, YEAST, CLUE
Trich, Wet Prep: NONE SEEN
WBC, Wet Prep HPF POC: NONE SEEN
Yeast Wet Prep HPF POC: NONE SEEN

## 2013-06-20 MED ORDER — CLINDAMYCIN HCL 300 MG PO CAPS: ORAL_CAPSULE | ORAL | Status: DC

## 2013-06-20 NOTE — Patient Instructions (Signed)

## 2013-06-20 NOTE — Progress Notes (Signed)
   Patient presented to the office today with several complaints. For the past several days she has been complaining of a vaginal discharge with some fish like odor but no pruritus. She is in a monogamous relationship. 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 She had the IUD removed in December 2014. She brought her ovulatory/menstrual calendar it appears that she is ovulating on day 12. This is a new partner for her and he has had children in the past and size she with a different partner. Patient is currently taking her prenatal vitamins. Her cycles are otherwise regular. She also has been complaining of tiredness and fatigue as well as some hirsutism on her chin.  Exam: Head and neck: Coarse hair is noted on her chin Pelvic exam normal external genitalia Bartholin urethra Skene was within normal limits Vagina: Gray-like discharge was noted Cervix: No lesions or discharge Bimanual exam: Uterus anteverted normal size shape and consistency Adnexa: No palpable mass or tenderness Rectal exam not done  Wet prep Pos Amine, moderate clue cells, 2 numerous to count bacteria  Assessment/plan: #1 bacterial vaginosis. Since patient is currently on day 12 of her cycle where going to prescribed clindamycin 300 mg one by mouth twice a day for 7 days in the event that she were to conceive. #2 because of her hirsutism in the event of PCOS or late onset congenital adrenal hyperplasia we are going to check a 17 hydroxyprogesterone, and DHEAS, and total testosterone. #3 because of her tiredness and fatigue were want to check not only a hemoglobin A1c, TSH, and CBC we're also going to check a vitamin D level. #4 since patient will be approaching 33 years of age this coming may she does not conceived by June where going to begin further infertility evaluation to include HSG and semen analysis. #5 we discussed importance of continuing to use the ovulation predictor kit to time her intercourse. She is also  reminded to continue on her prenatal vitamin.

## 2013-06-21 ENCOUNTER — Encounter: Payer: Self-pay | Admitting: Gynecology

## 2013-06-21 LAB — HEMOGLOBIN A1C
Hgb A1c MFr Bld: 5.1 % (ref <5.7)
MEAN PLASMA GLUCOSE: 100 mg/dL (ref <117)

## 2013-06-21 LAB — VITAMIN D 25 HYDROXY (VIT D DEFICIENCY, FRACTURES): Vit D, 25-Hydroxy: 25 ng/mL — ABNORMAL LOW (ref 30–89)

## 2013-06-21 LAB — TESTOSTERONE: Testosterone: 36 ng/dL (ref 10–70)

## 2013-06-21 LAB — TSH: TSH: 0.996 u[IU]/mL (ref 0.350–4.500)

## 2013-06-24 ENCOUNTER — Other Ambulatory Visit: Payer: Self-pay | Admitting: Anesthesiology

## 2013-06-24 ENCOUNTER — Other Ambulatory Visit: Payer: Self-pay | Admitting: Gynecology

## 2013-06-24 DIAGNOSIS — E559 Vitamin D deficiency, unspecified: Secondary | ICD-10-CM

## 2013-06-24 MED ORDER — VITAMIN D (ERGOCALCIFEROL) 1.25 MG (50000 UNIT) PO CAPS: 50000.0000 [IU] | ORAL_CAPSULE | ORAL | Status: DC

## 2013-06-25 LAB — 17-HYDROXYPROGESTERONE: 17-OH-PROGESTERONE, LC/MS/MS: 34 ng/dL

## 2013-06-25 LAB — DHEA: DHEA: 294 ng/dL (ref 102–1185)

## 2013-08-05 ENCOUNTER — Other Ambulatory Visit: Payer: Self-pay | Admitting: Gynecology

## 2013-08-05 ENCOUNTER — Telehealth: Payer: Self-pay | Admitting: Gynecology

## 2013-08-05 DIAGNOSIS — Z3041 Encounter for surveillance of contraceptive pills: Secondary | ICD-10-CM

## 2013-08-05 NOTE — Telephone Encounter (Signed)
08/05/13-Pt has regular Medicaid ins and wanted coverage info. We can't call Medicaid directly for benefits. I explained to pt that they have covered some in the past but we couldn't guarantee they would for her. She will sign waiver and knows she will be responsible if Medicaid doesn't pay. Cost was given as $1150.00/wl

## 2013-08-06 ENCOUNTER — Ambulatory Visit: Payer: Medicaid Other | Admitting: Gynecology

## 2013-12-22 ENCOUNTER — Emergency Department (HOSPITAL_BASED_OUTPATIENT_CLINIC_OR_DEPARTMENT_OTHER)
Admission: EM | Admit: 2013-12-22 | Discharge: 2013-12-22 | Disposition: A | Discharge status: Home / Self Care | Payer: Medicaid Other | Attending: Emergency Medicine | Admitting: Emergency Medicine

## 2013-12-22 ENCOUNTER — Encounter (HOSPITAL_BASED_OUTPATIENT_CLINIC_OR_DEPARTMENT_OTHER): Payer: Self-pay | Admitting: Emergency Medicine

## 2013-12-22 DIAGNOSIS — Z79899 Other long term (current) drug therapy: Secondary | ICD-10-CM | POA: Insufficient documentation

## 2013-12-22 DIAGNOSIS — Z8719 Personal history of other diseases of the digestive system: Secondary | ICD-10-CM | POA: Insufficient documentation

## 2013-12-22 DIAGNOSIS — E559 Vitamin D deficiency, unspecified: Secondary | ICD-10-CM | POA: Diagnosis not present

## 2013-12-22 DIAGNOSIS — D649 Anemia, unspecified: Secondary | ICD-10-CM | POA: Insufficient documentation

## 2013-12-22 DIAGNOSIS — H60399 Other infective otitis externa, unspecified ear: Secondary | ICD-10-CM | POA: Diagnosis not present

## 2013-12-22 DIAGNOSIS — R51 Headache: Secondary | ICD-10-CM | POA: Diagnosis not present

## 2013-12-22 DIAGNOSIS — Z792 Long term (current) use of antibiotics: Secondary | ICD-10-CM | POA: Insufficient documentation

## 2013-12-22 DIAGNOSIS — H9209 Otalgia, unspecified ear: Secondary | ICD-10-CM | POA: Diagnosis present

## 2013-12-22 DIAGNOSIS — H6092 Unspecified otitis externa, left ear: Secondary | ICD-10-CM

## 2013-12-22 MED ORDER — CIPROFLOXACIN-DEXAMETHASONE 0.3-0.1 % OT SUSP: 4.0000 [drp] | Freq: Two times a day (BID) | OTIC | Status: DC

## 2013-12-22 MED ORDER — AMOXICILLIN 500 MG PO CAPS: 500.0000 mg | ORAL_CAPSULE | Freq: Three times a day (TID) | ORAL | Status: DC

## 2013-12-22 NOTE — Discharge Instructions (Signed)

## 2013-12-22 NOTE — ED Provider Notes (Signed)
CSN: [id number]     Arrival date & time 12/22/13  1133 History   First MD Initiated Contact with Patient 12/22/13 1148     Chief Complaint  Patient presents with  . Otalgia     (Consider location/radiation/quality/duration/timing/severity/associated sxs/prior Treatment) HPI Comments: Pt states that she has a uri about 2 weeks ago and she has had left ear pain and now has a headache. She states that the symptoms are worsening. Denies fever. Took the medication previously prescribed without relief  The history is provided by the patient. No language interpreter was used.    Past Medical History  Diagnosis Date  . Hemorrhoids   . Rectal bleeding   . Anemia   . Vitamin D deficiency    Past Surgical History  Procedure Laterality Date  . Hernia repair    . Cesarean section      3  . Cesarean section     No family history on file. History  Substance Use Topics  . Smoking status: Never Smoker   . Smokeless tobacco: Never Used  . Alcohol Use: Yes     Comment: SOCIALLY   OB History   Grav Para Term Preterm Abortions TAB SAB Ect Mult Living   Review of Systems  Constitutional: Negative for fever.  HENT: Positive for ear pain.   Respiratory: Negative.   Cardiovascular: Negative.   Neurological: Positive for headaches.      Allergies  Review of patient's allergies indicates no known allergies.  Home Medications   Prior to Admission medications   Medication Sig Start Date End Date Taking? Authorizing Provider  amoxicillin (AMOXIL) 500 MG capsule Take 1 capsule (500 mg total) by mouth 3 (three) times daily. 12/22/13   Teressa Lower, NP  Benzoyl Peroxide (ACNE MEDICATION EX) Apply topically.      Historical Provider, MD  ciprofloxacin-dexamethasone (CIPRODEX) otic suspension Place 4 drops into the left ear 2 (two) times daily. 12/22/13   Teressa Lower, NP  clindamycin (CLEOCIN) 300 MG capsule One PO BID for 7 days 06/20/13   Ok Edwards, MD   IRON PO Take by mouth.      Historical Provider, MD  Multiple Vitamin (MULTIVITAMIN) tablet Take 1 tablet by mouth daily.    Historical Provider, MD  Vitamin D, Ergocalciferol, (DRISDOL) 50000 UNITS CAPS capsule Take 1 capsule (50,000 Units total) by mouth every 7 (seven) days. 06/24/13   Ok Edwards, MD   BP 123/59  Pulse 64  Temp(Src) 98.2 F (36.8 C) (Oral)  Resp 16  Ht  (1.575 m)  Wt 146 lb (66.225 kg)  BMI 26.70 kg/m2  SpO2 97%  LMP 11/27/2013 Physical Exam  Nursing note and vitals reviewed. Constitutional: She appears well-developed and well-nourished.  HENT:  Left Ear: Tympanic membrane normal.  Mouth/Throat: Oropharynx is clear and moist.  Left ear canal red and drainage  Eyes: Conjunctivae and EOM are normal.  Neck: Normal range of motion. Neck supple.  Cardiovascular: Normal rate and regular rhythm.   Pulmonary/Chest: Effort normal and breath sounds normal.    ED Course  Procedures (including critical care time) Labs Review Labs Reviewed - No data to display  Imaging Review No results found.   EKG Interpretation None      MDM   Final diagnoses:  Otitis externa, left    Will treat with antibiotics.     Teressa Lower, NP 12/22/13 [phone]

## 2013-12-22 NOTE — ED Notes (Signed)
Pt recently treated for URI, now pt has left ear pain and headache.

## 2013-12-22 NOTE — ED Provider Notes (Signed)
Medical screening examination/treatment/procedure(s) were performed by non-physician practitioner and as supervising physician I was immediately available for consultation/collaboration.   EKG Interpretation None       Ethelda Chick, MD 12/22/13 1216

## 2014-01-26 ENCOUNTER — Encounter (HOSPITAL_BASED_OUTPATIENT_CLINIC_OR_DEPARTMENT_OTHER): Payer: Self-pay | Admitting: Emergency Medicine

## 2014-06-09 ENCOUNTER — Encounter: Payer: Medicaid Other | Admitting: Gynecology

## 2014-07-16 ENCOUNTER — Ambulatory Visit: Payer: Medicaid Other | Admitting: Gynecology

## 2014-08-19 ENCOUNTER — Telehealth: Payer: Self-pay | Admitting: *Deleted

## 2014-08-19 NOTE — Telephone Encounter (Signed)
(  Jill Thompson patient) pt called stating she had normal cycle on 08/04/14 lasted until 08/08/14, now another cycle on 08/17/14 heavy changing pads every 2 hours small clots, no pain , slight cramping, this is new problems, no pregnant. Pt would like recommendations? Please advise

## 2014-08-19 NOTE — Telephone Encounter (Signed)
Pt informed with the below. 

## 2014-08-19 NOTE — Telephone Encounter (Signed)
Left message for pt to call.

## 2014-08-19 NOTE — Telephone Encounter (Signed)
Pt left message in voicemail to call her. I called and no answer, I left my # to call back.

## 2014-08-19 NOTE — Telephone Encounter (Signed)
Best to schedule office visit/ overdue for annual exam, check home UPT, if this is the first time this occurred keep menstrual record and if cycles persist less than 21 days from day one to day 1, will need lab work .

## 2014-10-21 ENCOUNTER — Encounter: Payer: Medicaid Other | Admitting: Gynecology

## 2014-11-03 ENCOUNTER — Ambulatory Visit (INDEPENDENT_AMBULATORY_CARE_PROVIDER_SITE_OTHER): Payer: BLUE CROSS/BLUE SHIELD | Admitting: Gynecology

## 2014-11-03 ENCOUNTER — Encounter: Payer: Self-pay | Admitting: Gynecology

## 2014-11-03 VITALS — BP 120/86 | Ht 63.0 in | Wt 156.0 lb

## 2014-11-03 DIAGNOSIS — N760 Acute vaginitis: Secondary | ICD-10-CM

## 2014-11-03 DIAGNOSIS — Z01419 Encounter for gynecological examination (general) (routine) without abnormal findings: Secondary | ICD-10-CM | POA: Diagnosis not present

## 2014-11-03 DIAGNOSIS — L659 Nonscarring hair loss, unspecified: Secondary | ICD-10-CM

## 2014-11-03 DIAGNOSIS — Z8639 Personal history of other endocrine, nutritional and metabolic disease: Secondary | ICD-10-CM

## 2014-11-03 LAB — WET PREP FOR TRICH, YEAST, CLUE
Clue Cells Wet Prep HPF POC: NONE SEEN
Trich, Wet Prep: NONE SEEN
YEAST WET PREP: NONE SEEN

## 2014-11-03 NOTE — Addendum Note (Signed)
Addended by: Rushie Goltz on: 11/03/2014 05:11 PM   Modules accepted: Orders

## 2014-11-03 NOTE — Progress Notes (Signed)
Jill Thompson [birth year] [id number]   History:    34 y.o.  for annual gyn exam  Who is only complaint is of occasional hair loss. Last year she was complaining of  Hirsutism  And had a normal DHEAS , TSH, 17 hydroxyprogesterone N testosterone level. She is not using any form of contraception. She reports normal menstrual cycles.  Past medical history,surgical history, family history and social history were all reviewed and documented in the EPIC chart.  Gynecologic History Patient's last menstrual period was 10/05/2014. Contraception: none Last Pap:  2013. Results were: normal Last mammogram:  Not indicated. Results were:  Not indicated  Obstetric History OB History  Gravida Para Term Preterm AB SAB TAB Ectopic Multiple Living  4 3   1 1    3     # Outcome Date GA Lbr Len/2nd Weight Sex Delivery Anes PTL Lv  4 SAB           3 Para           2 Para           1 Para                ROS: A ROS was performed and pertinent positives and negatives are included in the history.  GENERAL: No fevers or chills. HEENT: No change in vision, no earache, sore throat or sinus congestion. NECK: No pain or stiffness. CARDIOVASCULAR: No chest pain or pressure. No palpitations. PULMONARY: No shortness of breath, cough or wheeze. GASTROINTESTINAL: No abdominal pain, nausea, vomiting or diarrhea, melena or bright red blood per rectum. GENITOURINARY: No urinary frequency, urgency, hesitancy or dysuria. MUSCULOSKELETAL: No joint or muscle pain, no back pain, no recent trauma. DERMATOLOGIC: No rash, no itching, no lesions. ENDOCRINE: No polyuria, polydipsia, no heat or cold intolerance. No recent change in weight. HEMATOLOGICAL: No anemia or easy bruising or bleeding. NEUROLOGIC: No headache, seizures, numbness, tingling or weakness. PSYCHIATRIC: No depression, no loss of interest in normal activity or change in sleep pattern.     Exam: chaperone present  BP 120/86 mmHg  Ht 5\' 3"  (1.6 m)  Wt 156 lb  (70.761 kg)  BMI 27.64 kg/m2  LMP 10/05/2014  Body mass index is 27.64 kg/(m^2).  General appearance : Well developed well nourished female. No acute distress HEENT: Eyes: no retinal hemorrhage or exudates,  Neck supple, trachea midline, no carotid bruits, no thyroidmegaly Lungs: Clear to auscultation, no rhonchi or wheezes, or rib retractions  Heart: Regular rate and rhythm, no murmurs or gallops Breast:Examined in sitting and supine position were symmetrical in appearance, no palpable masses or tenderness,  no skin retraction, no nipple inversion, no nipple discharge, no skin discoloration, no axillary or supraclavicular lymphadenopathy Abdomen: no palpable masses or tenderness, no rebound or guarding Extremities: no edema or skin discoloration or tenderness  Pelvic:  Bartholin, Urethra, Skene Glands: Within normal limits             Vagina: No gross lesions or discharge  Cervix: No gross lesions or discharge  Uterus   anteverted, normal size, shape and consistency, non-tender and mobile  Adnexa  Without masses or tenderness  Anus and perineum  normal   Rectovaginal  normal sphincter tone without palpated masses or tenderness             Hemoccult  Not indicated     Assessment/Plan:  34 y.o. female for annual exam  With complaint of hair loss. For this reason a full thyroid panel  along with testosterone level will be drawn along with the following screening labs: fasting lipid profile, comprehensive metabolic panel, CBC, and fasting lipid profile along with urinalysis. Patient was instructed to begin taking prenatal vitamins in the event that she were to get pregnant since she is not using any form of contraception. Her Pap smear was done today. A wet prep had been done because  Some mild itching that she had experienced since she had been on no by that was prescribed  At an urgent care approximately 1 week ago. The wet prep was negative today. Ok Edwards MD, 5:07 PM  11/03/2014

## 2014-11-04 ENCOUNTER — Other Ambulatory Visit (HOSPITAL_COMMUNITY)
Admission: RE | Admit: 2014-11-04 | Discharge: 2014-11-04 | Disposition: A | Discharge status: Home / Self Care | Payer: BLUE CROSS/BLUE SHIELD | Source: Ambulatory Visit | Attending: Gynecology | Admitting: Gynecology

## 2014-11-04 DIAGNOSIS — Z1151 Encounter for screening for human papillomavirus (HPV): Secondary | ICD-10-CM | POA: Diagnosis present

## 2014-11-04 DIAGNOSIS — Z01419 Encounter for gynecological examination (general) (routine) without abnormal findings: Secondary | ICD-10-CM | POA: Diagnosis not present

## 2014-11-04 LAB — URINALYSIS W MICROSCOPIC + REFLEX CULTURE
Bilirubin Urine: NEGATIVE
Crystals: NONE SEEN [HPF]
Glucose, UA: NEGATIVE
HGB URINE DIPSTICK: NEGATIVE
KETONES UR: NEGATIVE
LEUKOCYTES UA: NEGATIVE
Nitrite: NEGATIVE
Protein, ur: NEGATIVE
SPECIFIC GRAVITY, URINE: 1.026 (ref 1.001–1.035)
YEAST: NONE SEEN [HPF]
pH: 5.5 (ref 5.0–8.0)

## 2014-11-04 NOTE — Addendum Note (Signed)
Addended by: Berna Spare A on: 11/04/2014 08:08 AM   Modules accepted: Orders, SmartSet

## 2014-11-05 ENCOUNTER — Other Ambulatory Visit: Payer: Self-pay | Admitting: Gynecology

## 2014-11-05 MED ORDER — NITROFURANTOIN MONOHYD MACRO 100 MG PO CAPS: 100.0000 mg | ORAL_CAPSULE | Freq: Two times a day (BID) | ORAL | Status: DC

## 2014-11-06 LAB — CYTOLOGY - PAP

## 2014-11-06 LAB — URINE CULTURE

## 2014-11-09 ENCOUNTER — Other Ambulatory Visit: Payer: BLUE CROSS/BLUE SHIELD

## 2014-12-14 ENCOUNTER — Encounter (HOSPITAL_BASED_OUTPATIENT_CLINIC_OR_DEPARTMENT_OTHER): Payer: Self-pay

## 2014-12-14 ENCOUNTER — Emergency Department (HOSPITAL_BASED_OUTPATIENT_CLINIC_OR_DEPARTMENT_OTHER)
Admission: EM | Admit: 2014-12-14 | Discharge: 2014-12-14 | Disposition: A | Discharge status: Home / Self Care | Payer: BLUE CROSS/BLUE SHIELD | Attending: Emergency Medicine | Admitting: Emergency Medicine

## 2014-12-14 ENCOUNTER — Emergency Department (HOSPITAL_BASED_OUTPATIENT_CLINIC_OR_DEPARTMENT_OTHER): Payer: BLUE CROSS/BLUE SHIELD

## 2014-12-14 DIAGNOSIS — Z3202 Encounter for pregnancy test, result negative: Secondary | ICD-10-CM | POA: Insufficient documentation

## 2014-12-14 DIAGNOSIS — R5383 Other fatigue: Secondary | ICD-10-CM | POA: Diagnosis not present

## 2014-12-14 DIAGNOSIS — Z8719 Personal history of other diseases of the digestive system: Secondary | ICD-10-CM | POA: Diagnosis not present

## 2014-12-14 DIAGNOSIS — N72 Inflammatory disease of cervix uteri: Secondary | ICD-10-CM | POA: Diagnosis not present

## 2014-12-14 DIAGNOSIS — R109 Unspecified abdominal pain: Secondary | ICD-10-CM

## 2014-12-14 DIAGNOSIS — N760 Acute vaginitis: Secondary | ICD-10-CM | POA: Insufficient documentation

## 2014-12-14 DIAGNOSIS — Z862 Personal history of diseases of the blood and blood-forming organs and certain disorders involving the immune mechanism: Secondary | ICD-10-CM | POA: Insufficient documentation

## 2014-12-14 DIAGNOSIS — R110 Nausea: Secondary | ICD-10-CM | POA: Insufficient documentation

## 2014-12-14 DIAGNOSIS — R6883 Chills (without fever): Secondary | ICD-10-CM | POA: Diagnosis not present

## 2014-12-14 DIAGNOSIS — Z8639 Personal history of other endocrine, nutritional and metabolic disease: Secondary | ICD-10-CM | POA: Insufficient documentation

## 2014-12-14 LAB — WET PREP, GENITAL
Clue Cells Wet Prep HPF POC: NONE SEEN
Trich, Wet Prep: NONE SEEN
YEAST WET PREP: NONE SEEN

## 2014-12-14 LAB — URINALYSIS, ROUTINE W REFLEX MICROSCOPIC
BILIRUBIN URINE: NEGATIVE
Glucose, UA: NEGATIVE mg/dL
Hgb urine dipstick: NEGATIVE
Ketones, ur: NEGATIVE mg/dL
LEUKOCYTES UA: NEGATIVE
NITRITE: NEGATIVE
PH: 7 (ref 5.0–8.0)
Protein, ur: NEGATIVE mg/dL
SPECIFIC GRAVITY, URINE: 1.005 (ref 1.005–1.030)
Urobilinogen, UA: 0.2 mg/dL (ref 0.0–1.0)

## 2014-12-14 LAB — PREGNANCY, URINE: Preg Test, Ur: NEGATIVE

## 2014-12-14 MED ORDER — AZITHROMYCIN 250 MG PO TABS
1000.0000 mg | ORAL_TABLET | Freq: Once | ORAL | Status: AC
  Administered 2014-12-14: 1000 mg via ORAL
  Filled 2014-12-14: qty 4

## 2014-12-14 MED ORDER — CEFTRIAXONE SODIUM 250 MG IJ SOLR
250.0000 mg | Freq: Once | INTRAMUSCULAR | Status: AC
  Administered 2014-12-14: 250 mg via INTRAMUSCULAR
  Filled 2014-12-14: qty 250

## 2014-12-14 MED ORDER — TRAMADOL HCL 50 MG PO TABS: 50.0000 mg | ORAL_TABLET | Freq: Four times a day (QID) | ORAL | Status: DC | PRN

## 2014-12-14 MED ORDER — LIDOCAINE HCL (PF) 1 % IJ SOLN
INTRAMUSCULAR | Status: AC
  Administered 2014-12-14: 1.2 mL
  Filled 2014-12-14: qty 5

## 2014-12-14 MED ORDER — DOXYCYCLINE HYCLATE 100 MG PO CAPS: 100.0000 mg | ORAL_CAPSULE | Freq: Two times a day (BID) | ORAL | Status: DC

## 2014-12-14 NOTE — ED Notes (Signed)
Left flank/lower back pain x 2 weeks-now c/o dysuria, chills, fatigue-NAD-steady gait

## 2014-12-14 NOTE — ED Provider Notes (Signed)
CSN: [id number]     Arrival date & time 12/14/14  1418 History   First MD Initiated Contact with Patient 12/14/14 1430     Chief Complaint  Patient presents with  . Flank Pain     (Consider location/radiation/quality/duration/timing/severity/associated sxs/prior Treatment) HPI Comments: 34 year old female complaining of left-sided flank and left lower back pain 2 weeks, worsening last night. Pain is a constant pressure with occasional episodes of sharp pain that radiates from her low back to her left flank rated 7/10, worse when laying down at night. No alleviating factors. Pain increased when urinating but denies a burning sensation when urinating. Denies increased urinary frequency or urgency denies vaginal bleeding or discharge. Had a similar episode 2 months ago and was diagnosed with UTI by her PCP and completed a course of antibiotics. Endorses chills without fever.  Patient is a 34 y.o. female presenting with flank pain. The history is provided by the patient.  Flank Pain This is a new problem. The current episode started 1 to 4 weeks ago. The problem occurs constantly. The problem has been gradually worsening. Associated symptoms include abdominal pain, chills, fatigue and nausea. Pertinent negatives include no fever or vomiting. Exacerbated by: laying. She has tried nothing for the symptoms.    Past Medical History  Diagnosis Date  . Hemorrhoids   . Rectal bleeding   . Anemia   . Vitamin D deficiency    Past Surgical History  Procedure Laterality Date  . Hernia repair    . Cesarean section      3  . Cesarean section     No family history on file. Social History  Substance Use Topics  . Smoking status: Never Smoker   . Smokeless tobacco: Never Used  . Alcohol Use: 0.0 oz/week    0 Standard drinks or equivalent per week     Comment: SOCIALLY   OB History    Gravida Para Term Preterm AB TAB SAB Ectopic Multiple Living   Review of Systems   Constitutional: Positive for chills and fatigue. Negative for fever.  Gastrointestinal: Positive for nausea and abdominal pain. Negative for vomiting.  Genitourinary: Positive for flank pain. Negative for menstrual problem and pelvic pain.  All other systems reviewed and are negative.     Allergies  Review of patient's allergies indicates no known allergies.  Home Medications   Prior to Admission medications   Medication Sig Start Date End Date Taking? Authorizing Provider  doxycycline (VIBRAMYCIN) 100 MG capsule Take 1 capsule (100 mg total) by mouth 2 (two) times daily. One po bid x 7 days 12/14/14   Kathrynn Speed, PA-C  traMADol (ULTRAM) 50 MG tablet Take 1 tablet (50 mg total) by mouth every 6 (six) hours as needed. 12/14/14   Robyn M Hess, PA-C   BP 122/65 mmHg  Pulse 60  Temp(Src) 98.7 F (37.1 C) (Oral)  Resp 18  Ht  (1.575 m)  Wt 151 lb (68.493 kg)  BMI 27.61 kg/m2  SpO2 99%  LMP 12/07/2014 Physical Exam  Constitutional: She is oriented to person, place, and time. She appears well-developed and well-nourished. No distress.  HENT:  Head: Normocephalic and atraumatic.  Mouth/Throat: Oropharynx is clear and moist.  Eyes: Conjunctivae and EOM are normal. Pupils are equal, round, and reactive to light.  Neck: Normal range of motion. Neck supple.  Cardiovascular: Normal rate, regular rhythm and normal heart sounds.  Pulmonary/Chest: Effort normal and breath sounds normal. No respiratory distress.  Abdominal: Soft. Normal appearance and bowel sounds are normal. She exhibits no distension. There is no rigidity, no rebound, no tenderness at McBurney's point and negative Murphy's sign.  LLQ/flank tenderness with voluntary guarding. No peritoneal signs. L CVAT.  Genitourinary: Uterus normal. Cervix exhibits discharge. Cervix exhibits no motion tenderness. Right adnexum displays no tenderness. Left adnexum displays no tenderness. There is tenderness in the vagina. No erythema  or bleeding in the vagina. Vaginal discharge found.  Musculoskeletal: Normal range of motion. She exhibits no edema.  Neurological: She is alert and oriented to person, place, and time. No sensory deficit.  Skin: Skin is warm and dry.  Psychiatric: She has a normal mood and affect. Her behavior is normal.  Nursing note and vitals reviewed.   ED Course  Procedures (including critical care time) Labs Review Labs Reviewed  WET PREP, GENITAL - Abnormal; Notable for the following:    WBC, Wet Prep HPF POC FEW (*)    All other components within normal limits  URINALYSIS, ROUTINE W REFLEX MICROSCOPIC (NOT AT [facility])  PREGNANCY, URINE  GC/CHLAMYDIA PROBE AMP ([place]) NOT AT [facility]    Imaging Review Ct Renal Stone Study  12/14/2014   CLINICAL DATA:  Left flank pain for 2 weeks.  EXAM: CT ABDOMEN AND PELVIS WITHOUT CONTRAST  TECHNIQUE: Multidetector CT imaging of the abdomen and pelvis was performed following the standard protocol without IV contrast.  COMPARISON:  None.  FINDINGS: Lung bases are clear.  No effusions.  Heart is normal size.  Liver, gallbladder, spleen, pancreas, adrenals and kidneys are normal. No renal or ureteral stones. No hydronephrosis.  Small locule of gas within the urinary bladder, presumably from recent catheterization. Recommend clinical correlation. Uterus and adnexa have an unremarkable unenhanced appearance. Appendix is visualized and is normal. Stomach, large and small bowel unremarkable. No free fluid, free air or adenopathy.  No acute bony abnormality or focal bone lesion.  IMPRESSION: No renal or ureteral stones.  No hydronephrosis.  Single locule of gas noted within the bladder, question recent catheterization. Recommend clinical correlation.   Electronically Signed   By: Charlett Nose M.D.   On: 12/14/2014 15:29   I have personally reviewed and evaluated these images and lab results as part of my medical decision-making.   EKG Interpretation None      MDM    Final diagnoses:  Cervicitis  Vaginitis   Non-toxic appearing, NAD. AFVSS. Abdomen soft with no peritoneal signs. Initial concern for possible stone, UA negative, CT without acute findings. Pelvic exam performed for further evaluation and the patient's pain. Has cervical and vaginal discharge without CMT or adnexal tenderness. On the CT, ovaries and uterus are visualized without any acute findings. Given that symptoms have been present for 2 weeks and had similar symptoms 2 months ago, low suspicion for ovarian torsion. Will treat cervicitis with Rocephin, azithromycin and doxycycline. Advised follow-up with PCP and OB/GYN. Stable for discharge. Return precautions given. Patient states understanding of treatment care plan and is agreeable.  Kathrynn Speed, PA-C 12/14/14 1626  Benjiman Core, MD 12/15/14 [phone]

## 2014-12-14 NOTE — ED Notes (Signed)
np at bedside

## 2014-12-14 NOTE — Discharge Instructions (Signed)
Take doxycycline as prescribed. Take tramadol as directed as needed for pain.  Abdominal Pain, Women Abdominal (stomach, pelvic, or belly) pain can be caused by many things. It is important to tell your doctor:  The location of the pain.  Does it come and go or is it present all the time?  Are there things that start the pain (eating certain foods, exercise)?  Are there other symptoms associated with the pain (fever, nausea, vomiting, diarrhea)? All of this is helpful to know when trying to find the cause of the pain. CAUSES   Stomach: virus or bacteria infection, or ulcer.  Intestine: appendicitis (inflamed appendix), regional ileitis (Crohn's disease), ulcerative colitis (inflamed colon), irritable bowel syndrome, diverticulitis (inflamed diverticulum of the colon), or cancer of the stomach or intestine.  Gallbladder disease or stones in the gallbladder.  Kidney disease, kidney stones, or infection.  Pancreas infection or cancer.  Fibromyalgia (pain disorder).  Diseases of the female organs:  Uterus: fibroid (non-cancerous) tumors or infection.  Fallopian tubes: infection or tubal pregnancy.  Ovary: cysts or tumors.  Pelvic adhesions (scar tissue).  Endometriosis (uterus lining tissue growing in the pelvis and on the pelvic organs).  Pelvic congestion syndrome (female organs filling up with blood just before the menstrual period).  Pain with the menstrual period.  Pain with ovulation (producing an egg).  Pain with an IUD (intrauterine device, birth control) in the uterus.  Cancer of the female organs.  Functional pain (pain not caused by a disease, may improve without treatment).  Psychological pain.  Depression. DIAGNOSIS  Your doctor will decide the seriousness of your pain by doing an examination.  Blood tests.  X-rays.  Ultrasound.  CT scan (computed tomography, special type of X-ray).  MRI (magnetic resonance imaging).  Cultures, for  infection.  Barium enema (dye inserted in the large intestine, to better view it with X-rays).  Colonoscopy (looking in intestine with a lighted tube).  Laparoscopy (minor surgery, looking in abdomen with a lighted tube).  Major abdominal exploratory surgery (looking in abdomen with a large incision). TREATMENT  The treatment will depend on the cause of the pain.   Many cases can be observed and treated at home.  Over-the-counter medicines recommended by your caregiver.  Prescription medicine.  Antibiotics, for infection.  Birth control pills, for painful periods or for ovulation pain.  Hormone treatment, for endometriosis.  Nerve blocking injections.  Physical therapy.  Antidepressants.  Counseling with a psychologist or psychiatrist.  Minor or major surgery. HOME CARE INSTRUCTIONS   Do not take laxatives, unless directed by your caregiver.  Take over-the-counter pain medicine only if ordered by your caregiver. Do not take aspirin because it can cause an upset stomach or bleeding.  Try a clear liquid diet (broth or water) as ordered by your caregiver. Slowly move to a bland diet, as tolerated, if the pain is related to the stomach or intestine.  Have a thermometer and take your temperature several times a day, and record it.  Bed rest and sleep, if it helps the pain.  Avoid sexual intercourse, if it causes pain.  Avoid stressful situations.  Keep your follow-up appointments and tests, as your caregiver orders.  If the pain does not go away with medicine or surgery, you may try:  Acupuncture.  Relaxation exercises (yoga, meditation).  Group therapy.  Counseling. SEEK MEDICAL CARE IF:   You notice certain foods cause stomach pain.  Your home care treatment is not helping your pain.  You need stronger  pain medicine.  You want your IUD removed.  You feel faint or lightheaded.  You develop nausea and vomiting.  You develop a rash.  You are  having side effects or an allergy to your medicine. SEEK IMMEDIATE MEDICAL CARE IF:   Your pain does not go away or gets worse.  You have a fever.  Your pain is felt only in portions of the abdomen. The right side could possibly be appendicitis. The left lower portion of the abdomen could be colitis or diverticulitis.  You are passing blood in your stools (bright red or black tarry stools, with or without vomiting).  You have blood in your urine.  You develop chills, with or without a fever.  You pass out. MAKE SURE YOU:   Understand these instructions.  Will watch your condition.  Will get help right away if you are not doing well or get worse. Document Released: 01/08/2007 Document Revised: 07/28/2013 Document Reviewed: 01/28/2009 [facility] Patient Information 2015 [place], [place]. This information is not intended to replace advice given to you by your health care provider. Make sure you discuss any questions you have with your health care provider.  Cervicitis Cervicitis is a soreness and swelling (inflammation) of the cervix. Your cervix is located at the bottom of your uterus. It opens up to the vagina. CAUSES   Sexually transmitted infections (STIs).   Allergic reaction.   Medicines or birth control devices that are put in the vagina.   Injury to the cervix.   Bacterial infections.  RISK FACTORS You are at greater risk if you:  Have unprotected sexual intercourse.  Have sexual intercourse with many partners.  Began sexual intercourse at an early age.  Have a history of STIs. SYMPTOMS  There may be no symptoms. If symptoms occur, they may include:   Gray, white, yellow, or bad-smelling vaginal discharge.   Pain or itching of the area outside the vagina.   Painful sexual intercourse.   Lower abdominal or lower back pain, especially during intercourse.   Frequent urination.   Abnormal vaginal bleeding between periods, after sexual intercourse,  or after menopause.   Pressure or a heavy feeling in the pelvis.  DIAGNOSIS  Diagnosis is made after a pelvic exam. Other tests may include:   Examination of any discharge under a microscope (wet prep).   A Pap test.  TREATMENT  Treatment will depend on the cause of cervicitis. If it is caused by an STI, both you and your partner will need to be treated. Antibiotic medicines will be given.  HOME CARE INSTRUCTIONS   Do not have sexual intercourse until your health care provider says it is okay.   Do not have sexual intercourse until your partner has been treated, if your cervicitis is caused by an STI.   Take your antibiotics as directed. Finish them even if you start to feel better.  SEEK MEDICAL CARE IF:  Your symptoms come back.   You have a fever.  MAKE SURE YOU:   Understand these instructions.  Will watch your condition.  Will get help right away if you are not doing well or get worse. Document Released: 03/13/2005 Document Revised: 03/18/2013 Document Reviewed: 09/04/2012 [facility] Patient Information 2015 [place], [place]. This information is not intended to replace advice given to you by your health care provider. Make sure you discuss any questions you have with your health care provider.

## 2014-12-14 NOTE — ED Notes (Signed)
Patient preparing for discharge. 

## 2014-12-15 LAB — GC/CHLAMYDIA PROBE AMP (~~LOC~~) NOT AT ARMC
Chlamydia: NEGATIVE
NEISSERIA GONORRHEA: NEGATIVE

## 2014-12-17 ENCOUNTER — Telehealth: Payer: Self-pay

## 2014-12-17 NOTE — Telephone Encounter (Signed)
Patient called stating she was seen in ER on 12/14/14 for pain and given antibiotics and pain meds. (ER notes are in her chart.)  She said they told her to follow up with Dr. Glenetta Hew. She is calling today to make appointment because pain really not changed too much with the treatments that prescribed. I explained Dr. Glenetta Hew off this week and another provider could see her but she insists that she wants to wait to see Dr. Glenetta Hew next week.  Appt scheduled for 12/22/14 .

## 2014-12-18 ENCOUNTER — Other Ambulatory Visit: Payer: BLUE CROSS/BLUE SHIELD

## 2014-12-18 DIAGNOSIS — Z01419 Encounter for gynecological examination (general) (routine) without abnormal findings: Secondary | ICD-10-CM

## 2014-12-18 DIAGNOSIS — Z8639 Personal history of other endocrine, nutritional and metabolic disease: Secondary | ICD-10-CM

## 2014-12-18 DIAGNOSIS — L659 Nonscarring hair loss, unspecified: Secondary | ICD-10-CM

## 2014-12-18 LAB — CBC WITH DIFFERENTIAL/PLATELET
BASOS PCT: 0 % (ref 0–1)
Basophils Absolute: 0 10*3/uL (ref 0.0–0.1)
Eosinophils Absolute: 0.1 10*3/uL (ref 0.0–0.7)
Eosinophils Relative: 3 % (ref 0–5)
HEMATOCRIT: 39.5 % (ref 36.0–46.0)
HEMOGLOBIN: 13.2 g/dL (ref 12.0–15.0)
LYMPHS ABS: 1.4 10*3/uL (ref 0.7–4.0)
LYMPHS PCT: 33 % (ref 12–46)
MCH: 29.8 pg (ref 26.0–34.0)
MCHC: 33.4 g/dL (ref 30.0–36.0)
MCV: 89.2 fL (ref 78.0–100.0)
MONOS PCT: 13 % — AB (ref 3–12)
MPV: 10.8 fL (ref 8.6–12.4)
Monocytes Absolute: 0.5 10*3/uL (ref 0.1–1.0)
NEUTROS ABS: 2.1 10*3/uL (ref 1.7–7.7)
NEUTROS PCT: 51 % (ref 43–77)
Platelets: 183 10*3/uL (ref 150–400)
RBC: 4.43 MIL/uL (ref 3.87–5.11)
RDW: 14.9 % (ref 11.5–15.5)
WBC: 4.1 10*3/uL (ref 4.0–10.5)

## 2014-12-18 LAB — COMPREHENSIVE METABOLIC PANEL
ALBUMIN: 4 g/dL (ref 3.6–5.1)
ALT: 17 U/L (ref 6–29)
AST: 18 U/L (ref 10–30)
Alkaline Phosphatase: 54 U/L (ref 33–115)
BUN: 10 mg/dL (ref 7–25)
CALCIUM: 8.8 mg/dL (ref 8.6–10.2)
CHLORIDE: 106 mmol/L (ref 98–110)
CO2: 25 mmol/L (ref 20–31)
CREATININE: 0.75 mg/dL (ref 0.50–1.10)
Glucose, Bld: 80 mg/dL (ref 65–99)
Potassium: 4 mmol/L (ref 3.5–5.3)
SODIUM: 138 mmol/L (ref 135–146)
TOTAL PROTEIN: 6.4 g/dL (ref 6.1–8.1)
Total Bilirubin: 1.3 mg/dL — ABNORMAL HIGH (ref 0.2–1.2)

## 2014-12-18 LAB — LIPID PANEL
CHOLESTEROL: 180 mg/dL (ref 125–200)
HDL: 47 mg/dL (ref >=46)
LDL Cholesterol: 113 mg/dL (ref <130)
TRIGLYCERIDES: 102 mg/dL (ref <150)
Total CHOL/HDL Ratio: 3.8 Ratio (ref <=5.0)
VLDL: 20 mg/dL (ref <30)

## 2014-12-18 LAB — THYROID PANEL WITH TSH
FREE THYROXINE INDEX: 2.5 (ref 1.4–3.8)
T3 Uptake: 30 % (ref 22–35)
T4, Total: 8.3 ug/dL (ref 4.5–12.0)
TSH: 0.961 u[IU]/mL (ref 0.350–4.500)

## 2014-12-19 LAB — TESTOSTERONE: Testosterone: 49 ng/dL (ref 10–70)

## 2014-12-19 LAB — VITAMIN D 25 HYDROXY (VIT D DEFICIENCY, FRACTURES): Vit D, 25-Hydroxy: 26 ng/mL — ABNORMAL LOW (ref 30–100)

## 2014-12-22 ENCOUNTER — Ambulatory Visit: Payer: BLUE CROSS/BLUE SHIELD | Admitting: Gynecology

## 2014-12-28 ENCOUNTER — Ambulatory Visit (INDEPENDENT_AMBULATORY_CARE_PROVIDER_SITE_OTHER): Payer: Medicaid Other | Admitting: Gynecology

## 2014-12-28 ENCOUNTER — Encounter: Payer: Self-pay | Admitting: Gynecology

## 2014-12-28 VITALS — BP 124/80

## 2014-12-28 DIAGNOSIS — R102 Pelvic and perineal pain: Secondary | ICD-10-CM

## 2014-12-28 LAB — URINALYSIS W MICROSCOPIC + REFLEX CULTURE
BACTERIA UA: NONE SEEN [HPF]
BILIRUBIN URINE: NEGATIVE
CRYSTALS: NONE SEEN [HPF]
Casts: NONE SEEN [LPF]
Glucose, UA: NEGATIVE
HGB URINE DIPSTICK: NEGATIVE
KETONES UR: NEGATIVE
Leukocytes, UA: NEGATIVE
Nitrite: NEGATIVE
PROTEIN: NEGATIVE
RBC / HPF: NONE SEEN RBC/HPF (ref <=2)
Specific Gravity, Urine: 1.017 (ref 1.001–1.035)
WBC UA: NONE SEEN WBC/HPF (ref <=5)
Yeast: NONE SEEN [HPF]
pH: 6 (ref 5.0–8.0)

## 2014-12-28 NOTE — Progress Notes (Signed)
   Patient is a 34 year old gravida 4 para 3 Ab1 who presented to the office for follow-up. Patient stated that in September 19 she presented to the emergency room complaining of left flank pain and left-sided pain. She is sexually active with a monogamous relationship and a denied any vaginal discharge, no frequency or dysuria. No fever, chills, nausea or vomiting. She had an extensive workup consisting of a CT scan to rule out renal stone and none was detected. Patient had a negative GC and Chlamydia culture and wet prep. She was treated for suspected PID-like picture and had received a shot or Rocephin and erythromycin and was sent home on doxycycline 100 mg twice a day for 7 days for which patient is only taking one tablet daily instead of twice a day as prescribed. She is in no acute distress today. Patient had been seen for annual exam on August 2016. Patient not using any form of contraception last menstrual cycle approximately less than one month ago. In the ED recently her urine pregnancy test was negative  Exam: Blood pressure 124/80 Gen. appearance: Well-developed well-nourished female in no acute distress Back: No CVA tenderness Abdomen: Soft nontender no rebound or guarding 30 leg raises did not elicit any back pain or abdominal pains Negative Rovsing negative obturator negative heel tap sign Pelvic: Bartholin urethra Skene was within normal limits Vagina: No lesions or discharge Cervix: No lesions or discharge Uterus: Anteverted normal size shape and consistency right adnexa nontender no masses or tenderness Left adnexa some tenderness and some guarding noted but no rebound Rectal exam: Not done   Assessment/plan: Patient recently treated for suspected PID although GC and Chlamydia culture was negative patient was instructed to take her Vibramycin twice a day instead of daily. She will return back to the office at the end of the week for an ultrasound of her pelvis. We will check her  urine today. Patient declined flu vaccine. She was instructed to refrain from intercourse for one week. The result of her symptoms that she presented to the emergency room could've been the fact that she could've been ovulating, could've passed a kidney stone and least but not the possibility of a PID-like picture for which she was appropriately treated.

## 2014-12-28 NOTE — Patient Instructions (Signed)
Pelvic Inflammatory Disease °Pelvic inflammatory disease (PID) refers to an infection in some or all of the female organs. The infection can be in the uterus, ovaries, fallopian tubes, or the surrounding tissues in the pelvis. PID can cause abdominal or pelvic pain that comes on suddenly (acute pelvic pain). PID is a serious infection because it can lead to lasting (chronic) pelvic pain or the inability to have children (infertile).  °CAUSES  °The infection is often caused by the normal bacteria found in the vaginal tissues. PID may also be caused by an infection that is spread during sexual contact. PID can also occur following:  °· The birth of a baby.   °· A miscarriage.   °· An abortion.   °· Major pelvic surgery.   °· The use of an intrauterine device (IUD).   °· A sexual assault.   °RISK FACTORS °Certain factors can put a person at higher risk for PID, such as: °· Being younger than 25 years. °· Being sexually active at a young age. °· Using nonbarrier contraception. °· Having multiple sexual partners. °· Having sex with someone who has symptoms of a genital infection. °· Using oral contraception. °Other times, certain behaviors can increase the possibility of getting PID, such as: °· Having sex during your period. °· Using a vaginal douche. °· Having an intrauterine device (IUD) in place. °SYMPTOMS  °· Abdominal or pelvic pain.   °· Fever.   °· Chills.   °· Abnormal vaginal discharge. °· Abnormal uterine bleeding.   °· Unusual pain shortly after finishing your period. °DIAGNOSIS  °Your caregiver will choose some of the following methods to make a diagnosis, such as:  °· Performing a physical exam and history. A pelvic exam typically reveals a very tender uterus and surrounding pelvis.   °· Ordering laboratory tests including a pregnancy test, blood tests, and urine test.  °· Ordering cultures of the vagina and cervix to check for a sexually transmitted infection (STI). °· Performing an ultrasound.    °· Performing a laparoscopic procedure to look inside the pelvis.   °TREATMENT  °· Antibiotic medicines may be prescribed and taken by mouth.   °· Sexual partners may be treated when the infection is caused by a sexually transmitted disease (STD).   °· Hospitalization may be needed to give antibiotics intravenously. °· Surgery may be needed, but this is rare. °It may take weeks until you are completely well. If you are diagnosed with PID, you should also be checked for human immunodeficiency virus (HIV).   °HOME CARE INSTRUCTIONS  °· If given, take your antibiotics as directed. Finish the medicine even if you start to feel better.   °· Only take over-the-counter or prescription medicines for pain, discomfort, or fever as directed by your caregiver.   °· Do not have sexual intercourse until treatment is completed or as directed by your caregiver. If PID is confirmed, your recent sexual partner(s) will need treatment.   °· Keep your follow-up appointments. °SEEK MEDICAL CARE IF:  °· You have increased or abnormal vaginal discharge.   °· You need prescription medicine for your pain.   °· You vomit.   °· You cannot take your medicines.   °· Your partner has an STD.   °SEEK IMMEDIATE MEDICAL CARE IF:  °· You have a fever.   °· You have increased abdominal or pelvic pain.   °· You have chills.   °· You have pain when you urinate.   °· You are not better after 72 hours following treatment.   °MAKE SURE YOU:  °· Understand these instructions. °· Will watch your condition. °· Will get help right away if you are not doing well or get worse. °  Document Released: 03/13/2005 Document Revised: 07/08/2012 Document Reviewed: 03/09/2011 °[employer]® Patient Information ©2015 [employer]. This information is not intended to replace advice given to you by your health care provider. Make sure you discuss any questions you have with your health care provider. ° °

## 2015-01-01 ENCOUNTER — Encounter: Payer: Self-pay | Admitting: Gynecology

## 2015-01-01 ENCOUNTER — Ambulatory Visit (INDEPENDENT_AMBULATORY_CARE_PROVIDER_SITE_OTHER): Payer: Medicaid Other

## 2015-01-01 ENCOUNTER — Ambulatory Visit (INDEPENDENT_AMBULATORY_CARE_PROVIDER_SITE_OTHER): Payer: Medicaid Other | Admitting: Gynecology

## 2015-01-01 VITALS — BP 116/70

## 2015-01-01 DIAGNOSIS — R102 Pelvic and perineal pain: Secondary | ICD-10-CM

## 2015-01-01 DIAGNOSIS — Z23 Encounter for immunization: Secondary | ICD-10-CM | POA: Diagnosis not present

## 2015-01-01 NOTE — Addendum Note (Signed)
Addended by: Dayna Barker on: 01/01/2015 09:31 AM   Modules accepted: Orders

## 2015-01-01 NOTE — Progress Notes (Signed)
   Patient is a 34 year old gravida 4 para 3 AB 1 who presented to the office today to discuss ultrasound as part of her evaluation of her left sided pain that she experienced several weeks ago. Her history is as follows:  Patient was seen in the office October 3 with the following noted: "Patient stated that in September 19 she presented to the emergency room complaining of left flank pain and left-sided pain. She is sexually active with a monogamous relationship and a denied any vaginal discharge, no frequency or dysuria. No fever, chills, nausea or vomiting. She had an extensive workup consisting of a CT scan to rule out renal stone and none was detected. Patient had a negative GC and Chlamydia culture and wet prep. She was treated for suspected PID-like picture and had received a shot or Rocephin and erythromycin and was sent home on doxycycline 100 mg twice a day for 7 days for which patient is only taking one tablet daily instead of twice a day as prescribed. She is in no acute distress today. Patient had been seen for annual exam on August 2016. Patient not using any form of contraception last menstrual cycle approximately less than one month ago. In the ED recently her urine pregnancy test was negative"  Patient had an unremarkable pelvic exam and abdominal exam on the last office visit October 3 she has one more day of the Vibramycin to complete the corrected recommendation which was 1 tablet by mouth twice a day for 7 days. She is asymptomatic today. Ultrasound today demonstrated the following:  Uterus measured 9.5 x 7.4 x 5.5 cm endometrial stripe of 9.9 mm. (Last menstrual period 12/29/2014 normal) ovaries appeared to be normal. No free fluid in the cul-de-sac noted no uterine abnormalities.  Assessment/plan: Patient symptomatology as discussed previously made been attributed either the time of ovulation or sometimes sensation she may experience from her previous cesarean section probably  attributed to underlying pelvic adhesions. Patient was totally asymptomatic today. She was reassured. Patient not using any form of contraception will stay on prenatal vitamins. She states that she seriously will try to get pregnant in January but if she gets pregnant now is no major worries for her. Of note her urinalysis that last office visit was completely normal. Patient otherwise scheduled to return back in August 2017 for annual exam or when necessary.

## 2015-02-16 ENCOUNTER — Telehealth: Payer: Self-pay | Admitting: *Deleted

## 2015-02-16 NOTE — Telephone Encounter (Signed)
Pt informed with the below note, will transfer to front desk

## 2015-02-16 NOTE — Telephone Encounter (Signed)
Pt asked if you would be willing to give her  Rx for birth control to help with acne? Pt said she spoke with you about this on OV 01/01/15. Please advise

## 2015-02-16 NOTE — Telephone Encounter (Signed)
Appointment to discuss options as well as evaluation

## 2016-03-17 ENCOUNTER — Ambulatory Visit: Payer: Medicaid Other | Admitting: Women's Health

## 2016-04-13 ENCOUNTER — Encounter: Payer: Medicaid Other | Admitting: Gynecology

## 2016-04-14 ENCOUNTER — Encounter: Payer: Self-pay | Admitting: Gynecology

## 2016-04-14 ENCOUNTER — Ambulatory Visit (INDEPENDENT_AMBULATORY_CARE_PROVIDER_SITE_OTHER): Payer: Medicaid Other | Admitting: Gynecology

## 2016-04-14 VITALS — BP 122/80 | Ht 63.0 in | Wt 165.0 lb

## 2016-04-14 DIAGNOSIS — Z01419 Encounter for gynecological examination (general) (routine) without abnormal findings: Secondary | ICD-10-CM

## 2016-04-14 DIAGNOSIS — Z0000 Encounter for general adult medical examination without abnormal findings: Secondary | ICD-10-CM

## 2016-04-14 DIAGNOSIS — Z23 Encounter for immunization: Secondary | ICD-10-CM

## 2016-04-14 NOTE — Progress Notes (Signed)
Allea Kassner Heimann 06/30/1980 [id number]   History:    36 y.o.  for annual gyn exam who is a gravida 4 para 3 (3 cesarean sections) 1 miscarriage who has a new partner and this month was the first time based on ovulation predictor kit and timing her intercourse an effort to get pregnant. She reports otherwise normal menstrual cycles. She is currently on prenatal vitamins. She's had normal Pap smears.  Past medical history,surgical history, family history and social history were all reviewed and documented in the EPIC chart.  Gynecologic History Patient's last menstrual period was 03/20/2016 (approximate). Contraception: none Last Pap: 2013 2016. Results were: normal Last mammogram: Not indicated. Results were: normal  Obstetric History OB History  Gravida Para Term Preterm AB Living  _0 SAB TAB Ectopic Multiple Live Births  1            # Outcome Date GA Lbr Len/2nd Weight Sex Delivery Anes PTL Lv  4 SAB           3 Para           2 Para           1 Para                ROS: A ROS was performed and pertinent positives and negatives are included in the history.  GENERAL: No fevers or chills. HEENT: No change in vision, no earache, sore throat or sinus congestion. NECK: No pain or stiffness. CARDIOVASCULAR: No chest pain or pressure. No palpitations. PULMONARY: No shortness of breath, cough or wheeze. GASTROINTESTINAL: No abdominal pain, nausea, vomiting or diarrhea, melena or bright red blood per rectum. GENITOURINARY: No urinary frequency, urgency, hesitancy or dysuria. MUSCULOSKELETAL: No joint or muscle pain, no back pain, no recent trauma. DERMATOLOGIC: No rash, no itching, no lesions. ENDOCRINE: No polyuria, polydipsia, no heat or cold intolerance. No recent change in weight. HEMATOLOGICAL: No anemia or easy bruising or bleeding. NEUROLOGIC: No headache, seizures, numbness, tingling or weakness. PSYCHIATRIC: No depression, no loss of interest in normal activity or change  in sleep pattern.     Exam: chaperone present  BP 122/80   Ht 5' 3" (1.6 m)   Wt 165 lb (74.8 kg)   LMP 03/20/2016 (Approximate)   BMI 29.23 kg/m   Body mass index is 29.23 kg/m.  General appearance : Well developed well nourished female. No acute distress HEENT: Eyes: no retinal hemorrhage or exudates,  Neck supple, trachea midline, no carotid bruits, no thyroidmegaly Lungs: Clear to auscultation, no rhonchi or wheezes, or rib retractions  Heart: Regular rate and rhythm, no murmurs or gallops Breast:Examined in sitting and supine position were symmetrical in appearance, no palpable masses or tenderness,  no skin retraction, no nipple inversion, no nipple discharge, no skin discoloration, no axillary or supraclavicular lymphadenopathy Abdomen: no palpable masses or tenderness, no rebound or guarding Extremities: no edema or skin discoloration or tenderness  Pelvic:  Bartholin, Urethra, Skene Glands: Within normal limits             Vagina: No gross lesions or discharge  Cervix: No gross lesions or discharge  Uterus  anteverted, normal size, shape and consistency, non-tender and mobile  Adnexa  Without masses or tenderness  Anus and perineum  normal   Rectovaginal  normal sphincter tone without palpated masses or tenderness             Hemoccult not indicated  Assessment/Plan:  35 y.o. female for annual exam or return to the office next week in a fasting state for the following screening blood work: Comprehensive metabolic panel, fasting lipid profile, TSH, CBC, and urinalysis. Pap smear not done this year. We discussed that this will be patient is a fourth cesarean section which she successfully conceive and we discussed potential risk of premature delivery ruptured uterus and she is fully aware. Also with the issue of advanced age we discussed potential risk of aneuploidy, hypertension, diabetes and premature delivery.   FERNANDEZ,JUAN H MD, 1:04 PM 04/14/2016   

## 2016-04-14 NOTE — Patient Instructions (Signed)
Influenza Virus Vaccine (Flucelvax) Qu es este medicamento? La VACUNA ANTIGRIPAL ayuda a disminuir el riesgo de contraer la influenza, tambin conocida como la gripe. La vacuna solo ayuda a protegerle contra algunas cepas de influenza. MARCAS COMUNES: FLUCELVAX Qu le debo informar a mi profesional de la salud antes de tomar este medicamento? Necesita saber si usted presenta alguno de los siguientes problemas o situaciones: -trastorno de sangrado como hemofilia -fiebre o infeccin -sndrome de Guillain-Barre u otros problemas neurolgicos -problemas del sistema inmunolgico -infeccin por el virus de la inmunodeficiencia humana (VIH) o SIDA -niveles bajos de plaquetas en la sangre -esclerosis mltiple -una reaccin alrgica o inusual a las vacunas antigripales, a otros medicamentos, alimentos, colorantes o conservantes -si est embarazada o buscando quedar embarazada -si est amamantando a un beb Cmo debo utilizar este medicamento? Esta vacuna se administra mediante inyeccin por va intramuscular. Lo administra un profesional de la salud. Recibir una copia de informacin escrita sobre la vacuna antes de cada vacuna. Asegrese de leer este folleto cada vez cuidadosamente. Este folleto puede cambiar con frecuencia. Hable con su pediatra para informarse acerca del uso de este medicamento en nios. Puede requerir atencin especial. Qu sucede si me olvido de una dosis? No se aplica en este caso. Qu puede interactuar con este medicamento? -quimioterapia o radioterapia -medicamentos que suprimen el sistema inmunolgico, tales como etanercept, anakinra, infliximab y adalimumab -medicamentos que tratan o previenen cogulos sanguneos, como warfarina -fenitona -medicamentos esteroideos, como la prednisona o la cortisona -teofilina -vacunas A qu debo estar atento al usar este medicamento? Informe a su mdico o a su profesional de la salud sobre todos los efectos secundarios que  persistan despus de 3 das. Llame a su proveedor de atencin mdica si se presentan sntomas inusuales dentro de las 6 semanas de recibir esta vacuna. Es posible que todava pueda contraer la gripe, pero la enfermedad no ser tan fuerte como normalmente. No puede contraer la gripe de esta vacuna. La vacuna antigripal no le protege contra resfros u otras enfermedades que pueden causar fiebre. Debe vacunarse cada ao. Qu efectos secundarios puedo tener al utilizar este medicamento? Efectos secundarios que debe informar a su mdico o a su profesional de la salud tan pronto como sea posible: -reacciones alrgicas como erupcin cutnea, picazn o urticarias, hinchazn de la cara, labios o lengua Efectos secundarios que, por lo general, no requieren atencin mdica (debe informarlos a su mdico o a su profesional de la salud si persisten o si son molestos): -fiebre -dolor de cabeza -molestias y dolores musculares -dolor, sensibilidad, enrojecimiento o hinchazn en el lugar de la inyeccin -cansancio Dnde debo guardar mi medicina? Esta vacuna se administrar por un profesional de la salud en una clnica, farmacia, consultorio mdico u otro consultorio de un profesional de la salud. No se le suministrar esta vacuna para guardar en su domicilio.  2017 [employer]vier/[employer] (2011-02-27 16:29:16)  

## 2016-04-15 LAB — URINALYSIS W MICROSCOPIC + REFLEX CULTURE
BACTERIA UA: NONE SEEN [HPF]
BILIRUBIN URINE: NEGATIVE
CRYSTALS: NONE SEEN [HPF]
Casts: NONE SEEN [LPF]
Glucose, UA: NEGATIVE
HGB URINE DIPSTICK: NEGATIVE
KETONES UR: NEGATIVE
Leukocytes, UA: NEGATIVE
Nitrite: NEGATIVE
PROTEIN: NEGATIVE
RBC / HPF: NONE SEEN RBC/HPF (ref <=2)
SQUAMOUS EPITHELIAL / LPF: NONE SEEN [HPF] (ref <=5)
Specific Gravity, Urine: 1.009 (ref 1.001–1.035)
WBC UA: NONE SEEN WBC/HPF (ref <=5)
Yeast: NONE SEEN [HPF]
pH: 5.5 (ref 5.0–8.0)

## 2016-04-17 DIAGNOSIS — Z23 Encounter for immunization: Secondary | ICD-10-CM | POA: Diagnosis not present

## 2016-04-17 NOTE — Addendum Note (Signed)
Addended by: Kem ParkinsonBARNES, Violeta Lecount on: 04/17/2016 04:32 PM   Modules accepted: Orders

## 2016-04-18 ENCOUNTER — Other Ambulatory Visit: Payer: Medicaid Other

## 2016-04-18 LAB — CBC WITH DIFFERENTIAL/PLATELET
BASOS PCT: 0 %
Basophils Absolute: 0 cells/uL (ref 0–200)
Eosinophils Absolute: 204 cells/uL (ref 15–500)
Eosinophils Relative: 4 %
HCT: 40.8 % (ref 35.0–45.0)
HEMOGLOBIN: 13.6 g/dL (ref 11.7–15.5)
LYMPHS ABS: 2193 {cells}/uL (ref 850–3900)
Lymphocytes Relative: 43 %
MCH: 31 pg (ref 27.0–33.0)
MCHC: 33.3 g/dL (ref 32.0–36.0)
MCV: 92.9 fL (ref 80.0–100.0)
MONO ABS: 459 {cells}/uL (ref 200–950)
MONOS PCT: 9 %
MPV: 11.3 fL (ref 7.5–12.5)
NEUTROS ABS: 2244 {cells}/uL (ref 1500–7800)
Neutrophils Relative %: 44 %
PLATELETS: 192 10*3/uL (ref 140–400)
RBC: 4.39 MIL/uL (ref 3.80–5.10)
RDW: 13.2 % (ref 11.0–15.0)
WBC: 5.1 10*3/uL (ref 3.8–10.8)

## 2016-04-18 LAB — COMPREHENSIVE METABOLIC PANEL
ALBUMIN: 3.8 g/dL (ref 3.6–5.1)
ALK PHOS: 54 U/L (ref 33–115)
ALT: 10 U/L (ref 6–29)
AST: 16 U/L (ref 10–30)
BUN: 9 mg/dL (ref 7–25)
CO2: 24 mmol/L (ref 20–31)
CREATININE: 0.84 mg/dL (ref 0.50–1.10)
Calcium: 8.9 mg/dL (ref 8.6–10.2)
Chloride: 108 mmol/L (ref 98–110)
Glucose, Bld: 85 mg/dL (ref 65–99)
POTASSIUM: 4 mmol/L (ref 3.5–5.3)
Sodium: 139 mmol/L (ref 135–146)
TOTAL PROTEIN: 6.3 g/dL (ref 6.1–8.1)
Total Bilirubin: 0.7 mg/dL (ref 0.2–1.2)

## 2016-04-18 LAB — LIPID PANEL
CHOLESTEROL: 197 mg/dL (ref <200)
HDL: 43 mg/dL — ABNORMAL LOW (ref >50)
LDL Cholesterol: 140 mg/dL — ABNORMAL HIGH (ref <100)
Total CHOL/HDL Ratio: 4.6 Ratio (ref <5.0)
Triglycerides: 72 mg/dL (ref <150)
VLDL: 14 mg/dL (ref <30)

## 2016-04-18 LAB — TSH: TSH: 1.33 mIU/L

## 2016-05-08 IMAGING — CT CT RENAL STONE PROTOCOL
1 of 2 series · 15 of 32 positions shown, 19 images · non-contrast
Comparison: None.

CLINICAL DATA: Left flank pain for 2 weeks.

EXAM:
CT ABDOMEN AND PELVIS WITHOUT CONTRAST
TECHNIQUE: Multidetector CT imaging of the abdomen and pelvis was performed
following the standard protocol without IV contrast.

[Series 2: renal stone < 200 lbs 5.0 b31f · axial · 0.67mm/px · z∈[-456,-36]mm · 15 of 94 slices shown, 19 images]
[im 5/94  soft-tissue]
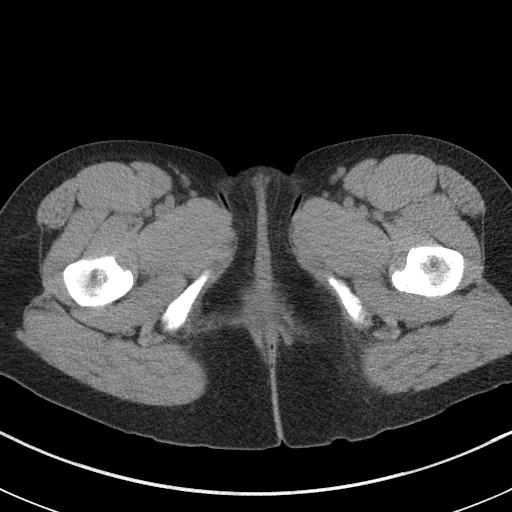
[im 5/94  bone]
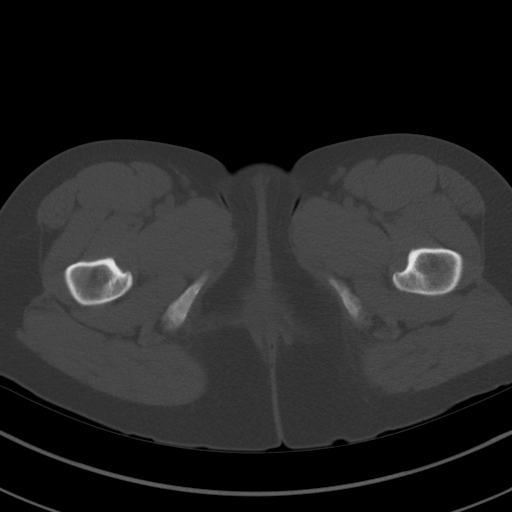
[im 13/94  soft-tissue]
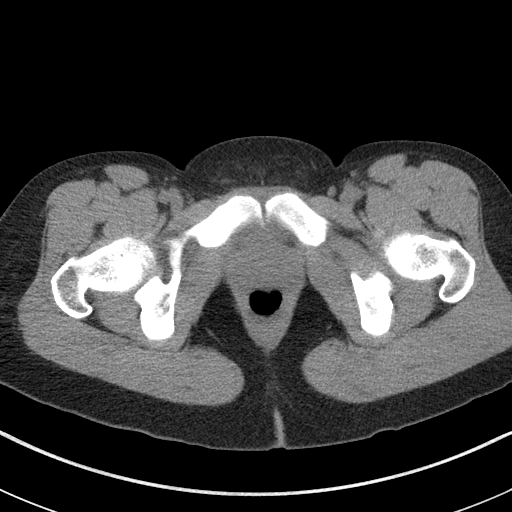
[im 21/94  soft-tissue]
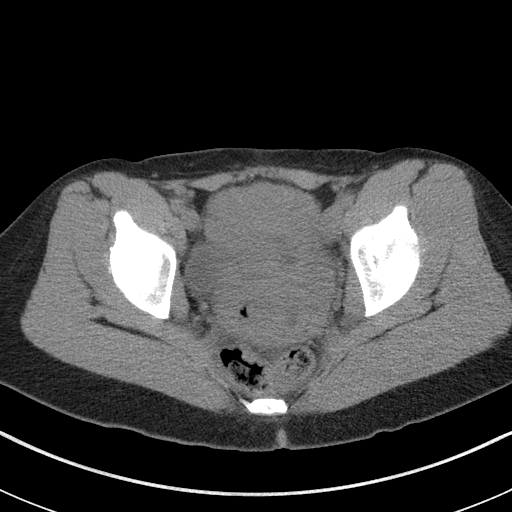
[im 25/94  soft-tissue]
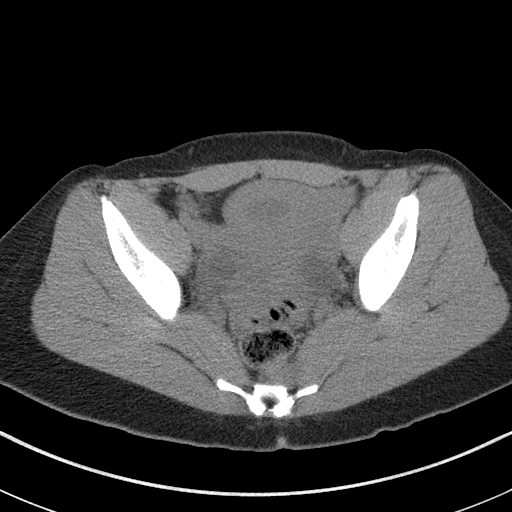
[im 33/94  soft-tissue]
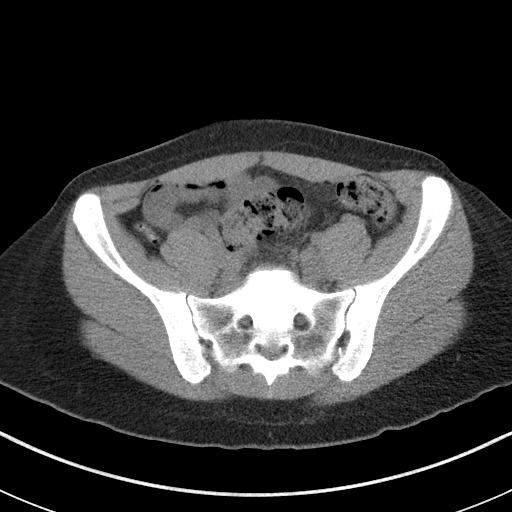
[im 41/94  soft-tissue]
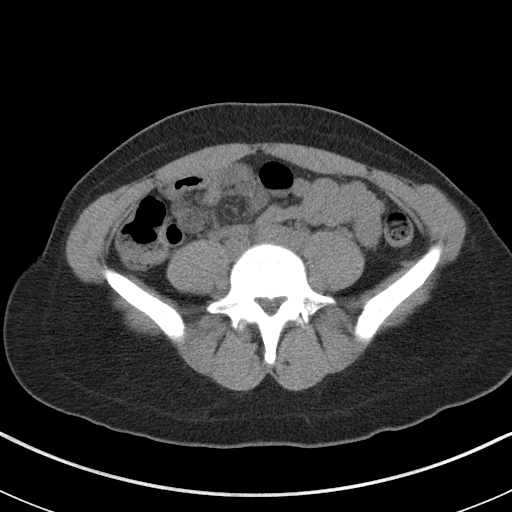
[im 49/94  soft-tissue]
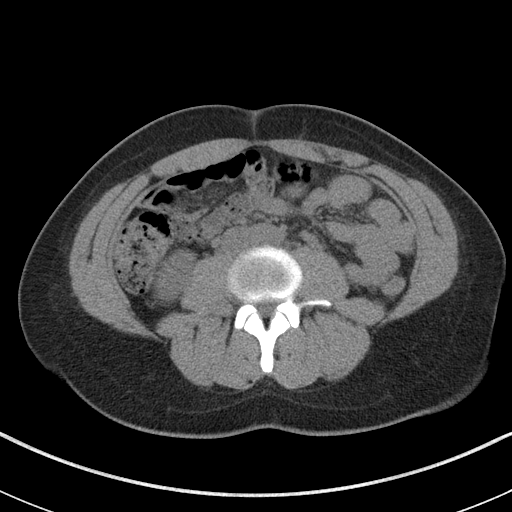
[im 53/94  soft-tissue]
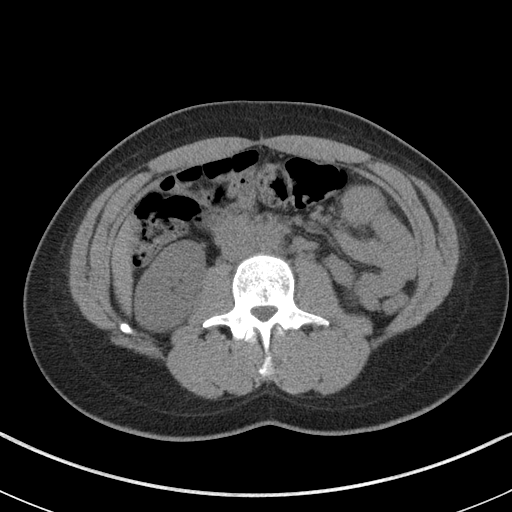
[im 61/94  soft-tissue]
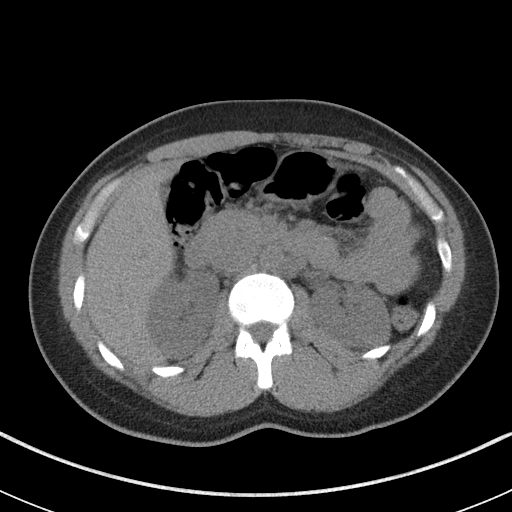
[im 61/94  bone]
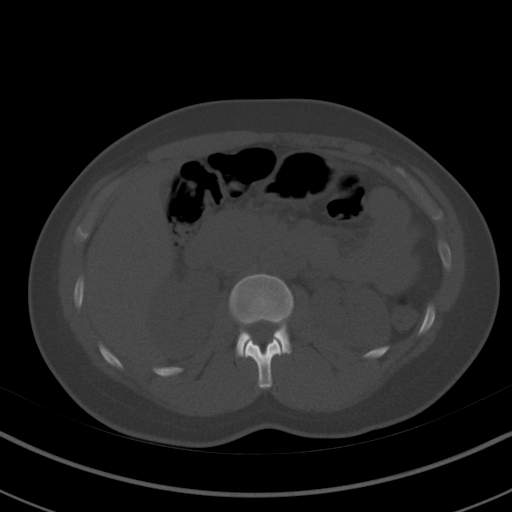
[im 69/94  soft-tissue]
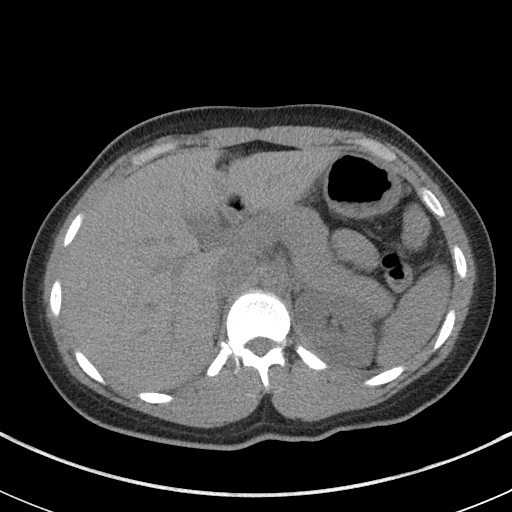
[im 73/94  soft-tissue]
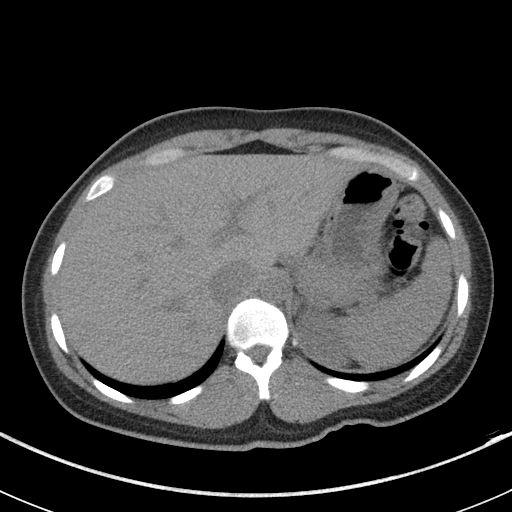
[im 77/94  lung]
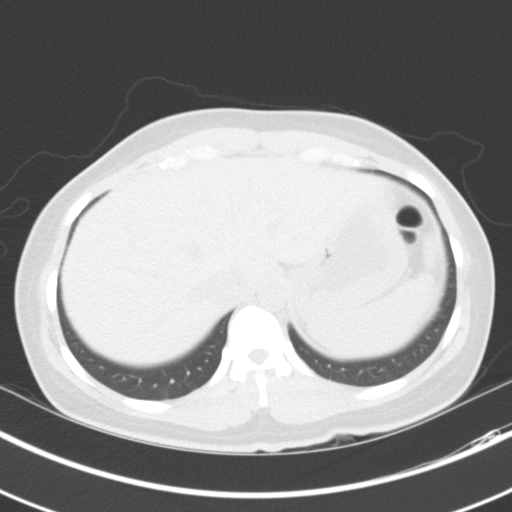
[im 81/94  soft-tissue]
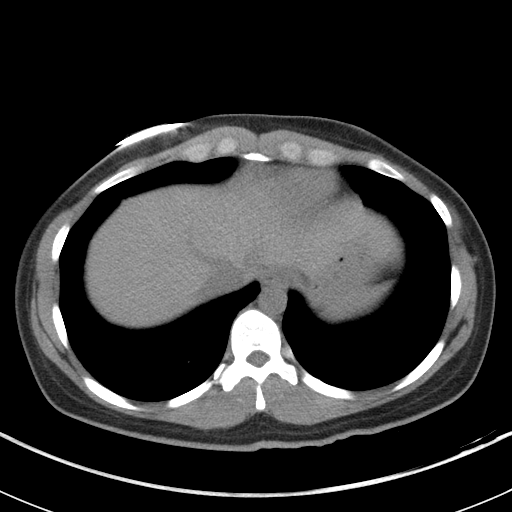
[im 81/94  lung]
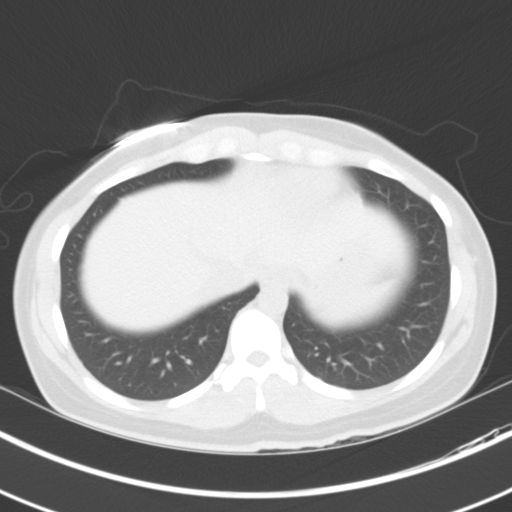
[im 85/94  lung]
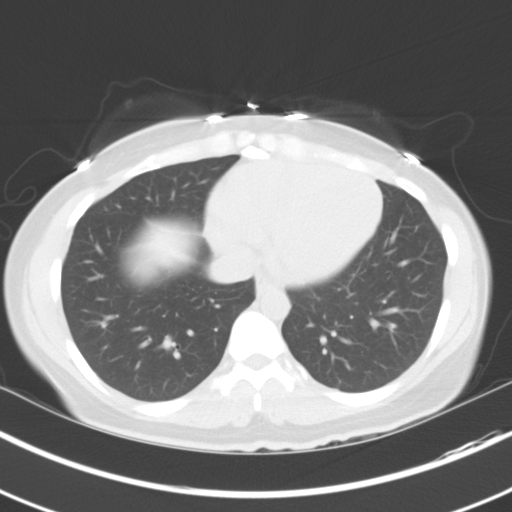
[im 89/94  soft-tissue]
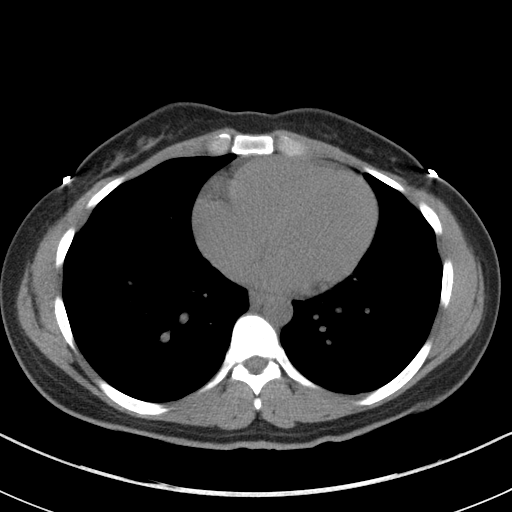
[im 89/94  lung]
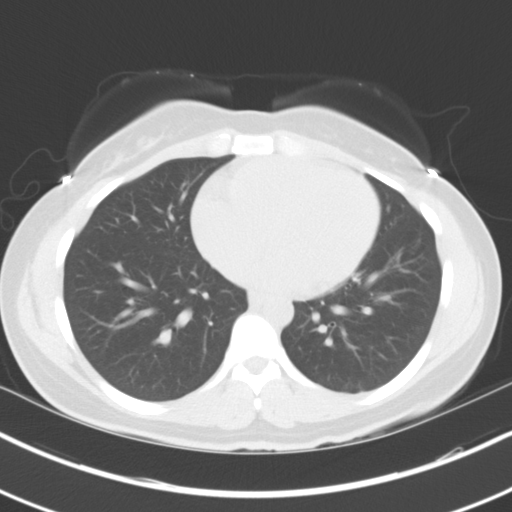

[15 of 32 positions shown; findings below may reference images not displayed]

FINDINGS: Lung bases are clear.  No effusions.  Heart is normal size.

Liver, gallbladder, spleen, pancreas, adrenals and kidneys are
normal. No renal or ureteral stones. No hydronephrosis.

Small locule of gas within the urinary bladder, presumably from
recent catheterization. Recommend clinical correlation. Uterus and
adnexa have an unremarkable unenhanced appearance. Appendix is
visualized and is normal. Stomach, large and small bowel
unremarkable. No free fluid, free air or adenopathy.

No acute bony abnormality or focal bone lesion.
IMPRESSION: No renal or ureteral stones.  No hydronephrosis.

Single locule of gas noted within the bladder, question recent
catheterization. Recommend clinical correlation.

## 2016-08-09 ENCOUNTER — Encounter: Payer: Self-pay | Admitting: Gynecology

## 2016-08-16 ENCOUNTER — Encounter: Payer: Self-pay | Admitting: Gynecology

## 2016-08-16 ENCOUNTER — Ambulatory Visit (INDEPENDENT_AMBULATORY_CARE_PROVIDER_SITE_OTHER): Payer: Medicaid Other | Admitting: Gynecology

## 2016-08-16 VITALS — BP 116/70

## 2016-08-16 DIAGNOSIS — Z3490 Encounter for supervision of normal pregnancy, unspecified, unspecified trimester: Secondary | ICD-10-CM

## 2016-08-16 DIAGNOSIS — Z3491 Encounter for supervision of normal pregnancy, unspecified, first trimester: Secondary | ICD-10-CM

## 2016-08-16 DIAGNOSIS — N912 Amenorrhea, unspecified: Secondary | ICD-10-CM

## 2016-08-16 LAB — PREGNANCY, URINE: Preg Test, Ur: POSITIVE — AB

## 2016-08-16 NOTE — Patient Instructions (Addendum)
First Trimester of Pregnancy The first trimester of pregnancy is from week 1 until the end of week 13 (months 1 through 3). A week after a sperm fertilizes an egg, the egg will implant on the wall of the uterus. This embryo will begin to develop into a baby. Genes from you and your partner will form the baby. The female genes will determine whether the baby will be a boy or a girl. At 6-8 weeks, the eyes and face will be formed, and the heartbeat can be seen on ultrasound. At the end of 12 weeks, all the baby's organs will be formed. Now that you are pregnant, you will want to do everything you can to have a healthy baby. Two of the most important things are to get good prenatal care and to follow your health care provider's instructions. Prenatal care is all the medical care you receive before the baby's birth. This care will help prevent, find, and treat any problems during the pregnancy and childbirth. Body changes during your first trimester Your body goes through many changes during pregnancy. The changes vary from woman to woman.  You may gain or lose a couple of pounds at first.  You may feel sick to your stomach (nauseous) and you may throw up (vomit). If the vomiting is uncontrollable, call your health care provider.  You may tire easily.  You may develop headaches that can be relieved by medicines. All medicines should be approved by your health care provider.  You may urinate more often. Painful urination may mean you have a bladder infection.  You may develop heartburn as a result of your pregnancy.  You may develop constipation because certain hormones are causing the muscles that push stool through your intestines to slow down.  You may develop hemorrhoids or swollen veins (varicose veins).  Your breasts may begin to grow larger and become tender. Your nipples may stick out more, and the tissue that surrounds them (areola) may become darker.  Your gums may bleed and may be  sensitive to brushing and flossing.  Dark spots or blotches (chloasma, mask of pregnancy) may develop on your face. This will likely fade after the baby is born.  Your menstrual periods will stop.  You may have a loss of appetite.  You may develop cravings for certain kinds of food.  You may have changes in your emotions from day to day, such as being excited to be pregnant or being concerned that something may go wrong with the pregnancy and baby.  You may have more vivid and strange dreams.  You may have changes in your hair. These can include thickening of your hair, rapid growth, and changes in texture. Some women also have hair loss during or after pregnancy, or hair that feels dry or thin. Your hair will most likely return to normal after your baby is born. What to expect at prenatal visits During a routine prenatal visit:  You will be weighed to make sure you and the baby are growing normally.  Your blood pressure will be taken.  Your abdomen will be measured to track your baby's growth.  The fetal heartbeat will be listened to between weeks 10 and 14 of your pregnancy.  Test results from any previous visits will be discussed. Your health care provider may ask you:  How you are feeling.  If you are feeling the baby move.  If you have had any abnormal symptoms, such as leaking fluid, bleeding, severe headaches, or abdominal   cramping.  If you are using any tobacco products, including cigarettes, chewing tobacco, and electronic cigarettes.  If you have any questions. Other tests that may be performed during your first trimester include:  Blood tests to find your blood type and to check for the presence of any previous infections. The tests will also be used to check for low iron levels (anemia) and protein on red blood cells (Rh antibodies). Depending on your risk factors, or if you previously had diabetes during pregnancy, you may have tests to check for high blood sugar  that affects pregnant women (gestational diabetes).  Urine tests to check for infections, diabetes, or protein in the urine.  An ultrasound to confirm the proper growth and development of the baby.  Fetal screens for spinal cord problems (spina bifida) and Down syndrome.  HIV (human immunodeficiency virus) testing. Routine prenatal testing includes screening for HIV, unless you choose not to have this test.  You may need other tests to make sure you and the baby are doing well. Follow these instructions at home: Medicines   Follow your health care provider's instructions regarding medicine use. Specific medicines may be either safe or unsafe to take during pregnancy.  Take a prenatal vitamin that contains at least 600 micrograms (mcg) of folic acid.  If you develop constipation, try taking a stool softener if your health care provider approves. Eating and drinking   Eat a balanced diet that includes fresh fruits and vegetables, whole grains, good sources of protein such as meat, eggs, or tofu, and low-fat dairy. Your health care provider will help you determine the amount of weight gain that is right for you.  Avoid raw meat and uncooked cheese. These carry germs that can cause birth defects in the baby.  Eating four or five small meals rather than three large meals a day may help relieve nausea and vomiting. If you start to feel nauseous, eating a few soda crackers can be helpful. Drinking liquids between meals, instead of during meals, also seems to help ease nausea and vomiting.  Limit foods that are high in fat and processed sugars, such as fried and sweet foods.  To prevent constipation:  Eat foods that are high in fiber, such as fresh fruits and vegetables, whole grains, and beans.  Drink enough fluid to keep your urine clear or pale yellow. Activity   Exercise only as directed by your health care provider. Most women can continue their usual exercise routine during  pregnancy. Try to exercise for 30 minutes at least 5 days a week. Exercising will help you:  Control your weight.  Stay in shape.  Be prepared for labor and delivery.  Experiencing pain or cramping in the lower abdomen or lower back is a good sign that you should stop exercising. Check with your health care provider before continuing with normal exercises.  Try to avoid standing for long periods of time. Move your legs often if you must stand in one place for a long time.  Avoid heavy lifting.  Wear low-heeled shoes and practice good posture.  You may continue to have sex unless your health care provider tells you not to. Relieving pain and discomfort   Wear a good support bra to relieve breast tenderness.  Take warm sitz baths to soothe any pain or discomfort caused by hemorrhoids. Use hemorrhoid cream if your health care provider approves.  Rest with your legs elevated if you have leg cramps or low back pain.  If you develop   varicose veins in your legs, wear support hose. Elevate your feet for 15 minutes, 3-4 times a day. Limit salt in your diet. Prenatal care   Schedule your prenatal visits by the twelfth week of pregnancy. They are usually scheduled monthly at first, then more often in the last 2 months before delivery.  Write down your questions. Take them to your prenatal visits.  Keep all your prenatal visits as told by your health care provider. This is important. Safety   Wear your seat belt at all times when driving.  Make a list of emergency phone numbers, including numbers for family, friends, the hospital, and police and fire departments. General instructions   Ask your health care provider for a referral to a local prenatal education class. Begin classes no later than the beginning of month 6 of your pregnancy.  Ask for help if you have counseling or nutritional needs during pregnancy. Your health care provider can offer advice or refer you to specialists for  help with various needs.  Do not use hot tubs, steam rooms, or saunas.  Do not douche or use tampons or scented sanitary pads.  Do not cross your legs for long periods of time.  Avoid cat litter boxes and soil used by cats. These carry germs that can cause birth defects in the baby and possibly loss of the fetus by miscarriage or stillbirth.  Avoid all smoking, herbs, alcohol, and medicines not prescribed by your health care provider. Chemicals in these products affect the formation and growth of the baby.  Do not use any products that contain nicotine or tobacco, such as cigarettes and e-cigarettes. If you need help quitting, ask your health care provider. You may receive counseling support and other resources to help you quit.  Schedule a dentist appointment. At home, brush your teeth with a soft toothbrush and be gentle when you floss. Contact a health care provider if:  You have dizziness.  You have mild pelvic cramps, pelvic pressure, or nagging pain in the abdominal area.  You have persistent nausea, vomiting, or diarrhea.  You have a bad smelling vaginal discharge.  You have pain when you urinate.  You notice increased swelling in your face, hands, legs, or ankles.  You are exposed to fifth disease or chickenpox.  You are exposed to [place] measles (rubella) and have never had it. Get help right away if:  You have a fever.  You are leaking fluid from your vagina.  You have spotting or bleeding from your vagina.  You have severe abdominal cramping or pain.  You have rapid weight gain or loss.  You vomit blood or material that looks like coffee grounds. You develop a severe headache. Zika Virus Disease Prevention Zika virus disease, or Zika, is an illness that can spread to people from mosquitoes that carry the virus. It may also spread from person to person through infected body fluids. Zika first occurred in [place], but recently it has spread to new areas. The  virus occurs in tropical climates. The location of Zika continues to change. Most people who become infected with Zika virus do not develop serious illness. Zika may cause birth defects in an unborn baby whose mother is infected with the virus. It may also increase the risk of miscarriage. How does Zika virus disease spread? The main way that Zika virus spreads is through the bite of a certain type of mosquito. Unlike most types of mosquitoes, which bite only at night, the type of mosquito  that carries Zika virus bites both at night and during the day. Zika virus can also spread through sexual contact, through a blood transfusion, and from a mother to her baby before or during birth. Once you have had Zika virus disease, it is unlikely that you will get it again. What are the symptoms of Zika virus disease? In many cases, people who have been infected with Zika virus do not develop any symptoms. If symptoms appear, they usually start about a week after the person is infected. Symptoms are usually mild. They may include: Fever. Rash. Red eyes. Joint pain. A woman who is infected with Zika virus while pregnant is at risk of having her baby born with a condition in which the brain or head is smaller than expected (microcephaly). Babies who have microcephaly can have developmental delays, seizures, hearing problems, and vision problems. In rare cases, a person can develop a disorder called Guillain-Barre syndrome after having a viral illness such as Zika. Symptoms of this disorder include weakness in the arms, legs, or face. How do health care providers diagnose Zika virus disease? A sample of blood can be tested for Zika virus. How can Zika virus disease be prevented? There is no vaccine to prevent Zika. The best way to prevent the disease is to avoid infected mosquitoes and avoid exposure to body fluids that can spread the virus. Prevention is important for everyone. Because of the risk to unborn  babies, taking precautions is especially important for pregnant women, women who are trying to get pregnant, and sexual partners of these women. What should I know about traveling? The locations where [place] is being reported change often. To identify high-risk areas, check the CDC travel website: http://davidson-gomez.com/ Take all precautions to avoid mosquito bites if you live in, or travel to, any of the high-risk areas. Pregnant women, women who are trying to get pregnant, and sexual partners of these women should talk with their health care providers before and after traveling to a high-risk area. When you return from traveling to any high-risk area, continue taking actions to protect yourself against mosquito bites for 3 weeks, even if you show no signs of illness. This will prevent spreading Zika virus to uninfected mosquitoes. What steps should I take to avoid mosquito bites? Take these steps to avoid mosquito bites when you are in a high-risk area: Wear loose clothing that covers your arms and legs. Limit your outdoor activities. Do not open windows unless they have window screens. Sleep under mosquito nets. Use insect repellent. The best insect repellents have: DEET, picaridin, oil of lemon eucalyptus (OLE), or IR3535 in them. Higher amounts of an active ingredient in them. Remember that insect repellents are safe to use during pregnancy. Do not use OLE on children who are younger than 54 years of age. Do not use insect repellent on babies who are younger than 38 months of age. Cover your child's stroller with mosquito netting. Make sure netting fits snugly and that any loose netting does not cover your child's mouth or nose. Do not use a blanket as a mosquito-protection cover. Do not apply insect repellent underneath clothing. If you are using sunscreen, apply the sunscreen before applying the insect repellent. Treat clothing with permethrin. Do not apply permethrin directly to your  skin. Follow label directions for safe use. Get rid of standing water, where mosquitoes may reproduce. Standing water is often found in items such as buckets, bowls, animal food dishes, and flowerpots. What should I know about  donating blood? To prevent Zika from passing through blood, wait to donate blood until: 4 weeks after you have traveled to an area where [place] has been reported. 4 weeks after you have had symptoms of Zika. 4 weeks after you have had sexual contact with: A person who has had Zika. A person who has traveled to a high-risk area at any time during the 3 months before the sexual contact occurred. You must wait this long because Zika virus can live longer in semen than it can live in blood. What should I know about the sexual transmission of Zika? People can spread Zika to their sexual partners during vaginal, anal, or oral sex, or by sharing sexual devices. Many people with [place] do not develop symptoms, so a person could spread the disease without knowing that they are infected. The greatest risk is to women who are pregnant or who may become pregnant. Couples can prevent sexual transmission of the virus by: Using condoms correctly during the entire duration of sexual activity, every time. This includes vaginal, anal, and oral sex. Not sharing sexual devices. Sharing increases your risk of being exposed to body fluid from another person. Avoiding all sexual activity until your health care provider says it is safe. These precautions are especially important for couples with a sexual partner who has traveled to an area with [place] or who has symptoms of Zika. Talk with your health care provider about your risk. This information is not intended to replace advice given to you by your health care provider. Make sure you discuss any questions you have with your health care provider. Document Released: 07/28/2014 Document Revised: 08/25/2015 Document Reviewed: 11/25/2014 Elsevier  Interactive Patient Education  2017 [employer]    You have shortness of breath.  You have any kind of trauma, such as from a fall or a car accident. Summary  The first trimester of pregnancy is from week 1 until the end of week 13 (months 1 through 3).  Your body goes through many changes during pregnancy. The changes vary from woman to woman.  You will have routine prenatal visits. During those visits, your health care provider will examine you, discuss any test results you may have, and talk with you about how you are feeling. This information is not intended to replace advice given to you by your health care provider. Make sure you discuss any questions you have with your health care provider. Document Released: 03/07/2001 Document Revised: 02/23/2016 Document Reviewed: 02/23/2016 Elsevier Interactive Patient Education  2017 [employer].   Pregnancy and Zika Virus Disease Zika virus disease, or Zika, is an illness that can spread to people from mosquitoes that carry the virus. It may also spread from person to person through infected body fluids. Zika first occurred in [place], but recently it has spread to new areas. The virus occurs in tropical climates. The location of Zika continues to change. Most people who become infected with Zika virus do not develop serious illness. However, Zika may cause birth defects in an unborn baby whose mother is infected with the virus. It may also increase the risk of miscarriage. What are the symptoms of Zika virus disease? In many cases, people who have been infected with Zika virus do not develop any symptoms. If symptoms appear, they usually start about a week after the person is infected. Symptoms are usually mild. They may include:  Fever.  Rash.  Red eyes.  Joint pain. How does Zika virus disease spread? The main  way that Zika virus spreads is through the bite of a certain type of mosquito. Unlike most types of mosquitos, which bite  only at night, the type of mosquito that carries Zika virus bites both at night and during the day. Zika virus can also spread through sexual contact, through a blood transfusion, and from a mother to her baby before or during birth. Once you have had Zika virus disease, it is unlikely that you will get it again. Can I pass Zika to my baby during pregnancy? Yes, Zika can pass from a mother to her baby before or during birth. What problems can Zika cause for my baby? A woman who is infected with Zika virus while pregnant is at risk of having her baby born with a condition in which the brain or head is smaller than expected (microcephaly). Babies who have microcephaly can have developmental delays, seizures, hearing problems, and vision problems. Having Zika virus disease during pregnancy can also increase the risk of miscarriage. How can Zika virus disease be prevented? There is no vaccine to prevent Zika. The best way to prevent the disease is to avoid infected mosquitoes and avoid exposure to body fluids that can spread the virus. Avoid any possible exposure to Zika by taking the following precautions. For women and their sex partners:  Avoid traveling to high-risk areas. The locations where [place] is being reported change often. To identify high-risk areas, check the CDC travel website: http://davidson-gomez.com/  If you or your sex partner must travel to a high-risk area, talk with a health care provider before and after traveling.  Take all precautions to avoid mosquito bites if you live in, or travel to, any of the high-risk areas. Insect repellents are safe to use during pregnancy.  Ask your health care provider when it is safe to have sexual contact. For women:  If you are pregnant or trying to become pregnant, avoid sexual contact with persons who may have been exposed to [place] virus, persons who have possible symptoms of Zika, or persons whose history you are unsure about. If you  choose to have sexual contact with someone who may have been exposed to [place] virus, use condoms correctly during the entire duration of sexual activity, every time. Do not share sexual devices, as you may be exposed to body fluids.  Ask your health care provider about when it is safe to attempt pregnancy after a possible exposure to Zika virus. What steps should I take to avoid mosquito bites? Take these steps to avoid mosquito bites when you are in a high-risk area:  Wear loose clothing that covers your arms and legs.  Limit your outdoor activities.  Do not open windows unless they have window screens.  Sleep under mosquito nets.  Use insect repellent. The best insect repellents have:  DEET, picaridin, oil of lemon eucalyptus (OLE), or IR3535 in them.  Higher amounts of an active ingredient in them.  Remember that insect repellents are safe to use during pregnancy.  Do not use OLE on children who are younger than 105 years of age. Do not use insect repellent on babies who are younger than 27 months of age.  Cover your child's stroller with mosquito netting. Make sure the netting fits snugly and that any loose netting does not cover your child's mouth or nose. Do not use a blanket as a mosquito-protection cover.  Do not apply insect repellent underneath clothing.  If you are using sunscreen, apply the sunscreen before applying the insect  repellent.  Treat clothing with permethrin. Do not apply permethrin directly to your skin. Follow label directions for safe use.  Get rid of standing water, where mosquitoes may reproduce. Standing water is often found in items such as buckets, bowls, animal food dishes, and flowerpots. When you return from traveling to any high-risk area, continue taking actions to protect yourself against mosquito bites for 3 weeks, even if you show no signs of illness. This will prevent spreading Zika virus to uninfected mosquitoes. What should I know about the  sexual transmission of Zika? People can spread Zika to their sexual partners during vaginal, anal, or oral sex, or by sharing sexual devices. Many people with [place] do not develop symptoms, so a person could spread the disease without knowing that they are infected. The greatest risk is to women who are pregnant or who may become pregnant. Zika virus can live longer in semen than it can live in blood. Couples can prevent sexual transmission of the virus by:  Using condoms correctly during the entire duration of sexual activity, every time. This includes vaginal, anal, and oral sex.  Not sharing sexual devices. Sharing increases your risk of being exposed to body fluid from another person.  Avoiding all sexual activity until your health care provider says it is safe. Should I be tested for Zika virus? A sample of your blood can be tested for Zika virus. A pregnant woman should be tested if she may have been exposed to the virus or if she has symptoms of Zika. She may also have additional tests done during her pregnancy, such ultrasound testing. Talk with your health care provider about which tests are recommended. This information is not intended to replace advice given to you by your health care provider. Make sure you discuss any questions you have with your health care provider. Document Released: 12/02/2014 Document Revised: 08/19/2015 Document Reviewed: 11/25/2014 Elsevier Interactive Patient Education  2017 [employer].
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
## 2016-08-16 NOTE — Addendum Note (Signed)
Addended by: Dayna BarkerGARDNER, KIMBERLY K on: 08/16/2016 01:16 PM   Modules accepted: Orders

## 2016-09-06 ENCOUNTER — Telehealth: Payer: Self-pay | Admitting: *Deleted
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
## 2016-09-06 NOTE — Telephone Encounter (Signed)
Left detailed message on voicemail.  
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
